# Patient Record
Sex: Male | Born: 1945 | Race: White | Hispanic: No | Marital: Married | Smoking: Former smoker
Health system: Southern US, Community
[De-identification: ages and names within clinical notes are randomized; demographics above are authoritative.]

## PROBLEM LIST (undated history)

## (undated) DIAGNOSIS — Z972 Presence of dental prosthetic device (complete) (partial): Secondary | ICD-10-CM

## (undated) DIAGNOSIS — E785 Hyperlipidemia, unspecified: Secondary | ICD-10-CM

## (undated) DIAGNOSIS — K08109 Complete loss of teeth, unspecified cause, unspecified class: Secondary | ICD-10-CM

## (undated) DIAGNOSIS — K219 Gastro-esophageal reflux disease without esophagitis: Secondary | ICD-10-CM

## (undated) DIAGNOSIS — M1990 Unspecified osteoarthritis, unspecified site: Secondary | ICD-10-CM

## (undated) DIAGNOSIS — I2510 Atherosclerotic heart disease of native coronary artery without angina pectoris: Secondary | ICD-10-CM

## (undated) DIAGNOSIS — D649 Anemia, unspecified: Secondary | ICD-10-CM

## (undated) DIAGNOSIS — I10 Essential (primary) hypertension: Secondary | ICD-10-CM

## (undated) HISTORY — PX: ROTATOR CUFF REPAIR: SHX139

## (undated) HISTORY — PX: TONSILLECTOMY: SUR1361

## (undated) HISTORY — PX: FRACTURE SURGERY: SHX138

## (undated) HISTORY — PX: HERNIA REPAIR: SHX51

## (undated) HISTORY — PX: UMBILICAL HERNIA REPAIR: SHX196

## (undated) HISTORY — PX: BACK SURGERY: SHX140

---

## 1977-02-06 HISTORY — PX: APPENDECTOMY: SHX54

## 1993-02-06 DIAGNOSIS — I259 Chronic ischemic heart disease, unspecified: Secondary | ICD-10-CM | POA: Insufficient documentation

## 2004-02-07 HISTORY — PX: CORONARY ANGIOPLASTY WITH STENT PLACEMENT: SHX49

## 2005-12-11 ENCOUNTER — Ambulatory Visit: Payer: Self-pay | Admitting: Gastroenterology

## 2010-02-06 HISTORY — PX: ORIF FEMUR FRACTURE: SHX2119

## 2010-12-13 ENCOUNTER — Inpatient Hospital Stay: Payer: Self-pay | Admitting: Internal Medicine

## 2010-12-13 HISTORY — PX: FLEXIBLE SIGMOIDOSCOPY: SHX1649

## 2010-12-19 ENCOUNTER — Inpatient Hospital Stay: Payer: Self-pay | Admitting: Internal Medicine

## 2011-02-07 HISTORY — PX: CHOLECYSTECTOMY: SHX55

## 2011-02-10 ENCOUNTER — Ambulatory Visit: Payer: Self-pay | Admitting: Unknown Physician Specialty

## 2011-02-10 DIAGNOSIS — D126 Benign neoplasm of colon, unspecified: Secondary | ICD-10-CM | POA: Diagnosis not present

## 2011-02-10 DIAGNOSIS — Z79899 Other long term (current) drug therapy: Secondary | ICD-10-CM | POA: Diagnosis not present

## 2011-02-10 DIAGNOSIS — K648 Other hemorrhoids: Secondary | ICD-10-CM | POA: Diagnosis not present

## 2011-02-10 DIAGNOSIS — K5289 Other specified noninfective gastroenteritis and colitis: Secondary | ICD-10-CM | POA: Diagnosis not present

## 2011-02-10 DIAGNOSIS — I2510 Atherosclerotic heart disease of native coronary artery without angina pectoris: Secondary | ICD-10-CM | POA: Diagnosis not present

## 2011-02-10 DIAGNOSIS — Z7982 Long term (current) use of aspirin: Secondary | ICD-10-CM | POA: Diagnosis not present

## 2011-02-10 DIAGNOSIS — K5730 Diverticulosis of large intestine without perforation or abscess without bleeding: Secondary | ICD-10-CM | POA: Diagnosis not present

## 2011-02-13 LAB — PATHOLOGY REPORT

## 2011-05-30 DIAGNOSIS — K5732 Diverticulitis of large intestine without perforation or abscess without bleeding: Secondary | ICD-10-CM | POA: Diagnosis not present

## 2011-12-05 ENCOUNTER — Inpatient Hospital Stay: Payer: Self-pay | Admitting: Specialist

## 2011-12-05 DIAGNOSIS — Z7902 Long term (current) use of antithrombotics/antiplatelets: Secondary | ICD-10-CM | POA: Diagnosis not present

## 2011-12-05 DIAGNOSIS — I10 Essential (primary) hypertension: Secondary | ICD-10-CM | POA: Diagnosis not present

## 2011-12-05 DIAGNOSIS — E785 Hyperlipidemia, unspecified: Secondary | ICD-10-CM | POA: Diagnosis not present

## 2011-12-05 DIAGNOSIS — IMO0002 Reserved for concepts with insufficient information to code with codable children: Secondary | ICD-10-CM | POA: Diagnosis not present

## 2011-12-05 DIAGNOSIS — S72143A Displaced intertrochanteric fracture of unspecified femur, initial encounter for closed fracture: Secondary | ICD-10-CM | POA: Diagnosis not present

## 2011-12-05 DIAGNOSIS — Z23 Encounter for immunization: Secondary | ICD-10-CM | POA: Diagnosis not present

## 2011-12-05 DIAGNOSIS — R6889 Other general symptoms and signs: Secondary | ICD-10-CM | POA: Diagnosis not present

## 2011-12-05 DIAGNOSIS — Z9861 Coronary angioplasty status: Secondary | ICD-10-CM | POA: Diagnosis not present

## 2011-12-05 DIAGNOSIS — Z01818 Encounter for other preprocedural examination: Secondary | ICD-10-CM | POA: Diagnosis not present

## 2011-12-05 DIAGNOSIS — M1990 Unspecified osteoarthritis, unspecified site: Secondary | ICD-10-CM | POA: Diagnosis not present

## 2011-12-05 DIAGNOSIS — S72009A Fracture of unspecified part of neck of unspecified femur, initial encounter for closed fracture: Secondary | ICD-10-CM | POA: Diagnosis not present

## 2011-12-05 DIAGNOSIS — Z7982 Long term (current) use of aspirin: Secondary | ICD-10-CM | POA: Diagnosis not present

## 2011-12-05 DIAGNOSIS — I2510 Atherosclerotic heart disease of native coronary artery without angina pectoris: Secondary | ICD-10-CM | POA: Diagnosis not present

## 2011-12-05 DIAGNOSIS — M25559 Pain in unspecified hip: Secondary | ICD-10-CM | POA: Diagnosis not present

## 2011-12-05 LAB — URINALYSIS, COMPLETE
Bacteria: NONE SEEN
Bilirubin,UR: NEGATIVE
Glucose,UR: NEGATIVE mg/dL (ref 0–75)
Ketone: NEGATIVE
Leukocyte Esterase: NEGATIVE
Ph: 5 (ref 4.5–8.0)
RBC,UR: 2 /HPF (ref 0–5)
Specific Gravity: 1.02 (ref 1.003–1.030)
Squamous Epithelial: NONE SEEN

## 2011-12-05 LAB — COMPREHENSIVE METABOLIC PANEL
Albumin: 3.7 g/dL (ref 3.4–5.0)
BUN: 16 mg/dL (ref 7–18)
Calcium, Total: 8.9 mg/dL (ref 8.5–10.1)
Chloride: 108 mmol/L — ABNORMAL HIGH (ref 98–107)
EGFR (African American): >60
Glucose: 131 mg/dL — ABNORMAL HIGH (ref 65–99)
Osmolality: 286 (ref 275–301)
SGOT(AST): 40 U/L — ABNORMAL HIGH (ref 15–37)
SGPT (ALT): 31 U/L (ref 12–78)
Sodium: 142 mmol/L (ref 136–145)
Total Protein: 7.1 g/dL (ref 6.4–8.2)

## 2011-12-05 LAB — CBC WITH DIFFERENTIAL/PLATELET
Basophil #: 0 10*3/uL (ref 0.0–0.1)
Basophil %: 0.4 %
HGB: 12.4 g/dL — ABNORMAL LOW (ref 13.0–18.0)
Lymphocyte #: 2.2 10*3/uL (ref 1.0–3.6)
Lymphocyte %: 30.1 %
MCH: 30.2 pg (ref 26.0–34.0)
MCHC: 33.9 g/dL (ref 32.0–36.0)
Neutrophil %: 57.1 %
Platelet: 173 10*3/uL (ref 150–440)
WBC: 7.4 10*3/uL (ref 3.8–10.6)

## 2011-12-05 LAB — APTT: Activated PTT: 32.9 secs (ref 23.6–35.9)

## 2011-12-06 LAB — CBC WITH DIFFERENTIAL/PLATELET
Basophil #: 0 10*3/uL (ref 0.0–0.1)
Eosinophil #: 0.1 10*3/uL (ref 0.0–0.7)
Eosinophil %: 0.8 %
HCT: 32.7 % — ABNORMAL LOW (ref 40.0–52.0)
Lymphocyte %: 21.9 %
MCH: 31.6 pg (ref 26.0–34.0)
MCHC: 35.5 g/dL (ref 32.0–36.0)
MCV: 89 fL (ref 80–100)
Monocyte %: 12.2 %
Neutrophil #: 5 10*3/uL (ref 1.4–6.5)
Neutrophil %: 64.7 %
Platelet: 138 10*3/uL — ABNORMAL LOW (ref 150–440)
RBC: 3.68 10*6/uL — ABNORMAL LOW (ref 4.40–5.90)
RDW: 13.1 % (ref 11.5–14.5)
WBC: 7.7 10*3/uL (ref 3.8–10.6)

## 2011-12-06 LAB — BASIC METABOLIC PANEL
BUN: 17 mg/dL (ref 7–18)
Calcium, Total: 8.7 mg/dL (ref 8.5–10.1)
Chloride: 109 mmol/L — ABNORMAL HIGH (ref 98–107)
Creatinine: 0.95 mg/dL (ref 0.60–1.30)
EGFR (Non-African Amer.): >60
Glucose: 152 mg/dL — ABNORMAL HIGH (ref 65–99)
Potassium: 3.8 mmol/L (ref 3.5–5.1)

## 2011-12-07 LAB — CBC WITH DIFFERENTIAL/PLATELET
Basophil %: 0.3 %
Eosinophil %: 0.4 %
HCT: 28.5 % — ABNORMAL LOW (ref 40.0–52.0)
HGB: 9.8 g/dL — ABNORMAL LOW (ref 13.0–18.0)
Lymphocyte #: 1.5 10*3/uL (ref 1.0–3.6)
MCH: 30.7 pg (ref 26.0–34.0)
MCHC: 34.5 g/dL (ref 32.0–36.0)
MCV: 89 fL (ref 80–100)
Monocyte #: 1 x10 3/mm (ref 0.2–1.0)
Monocyte %: 12.8 %
Neutrophil #: 5 10*3/uL (ref 1.4–6.5)
Neutrophil %: 66.1 %
RBC: 3.21 10*6/uL — ABNORMAL LOW (ref 4.40–5.90)
WBC: 7.5 10*3/uL (ref 3.8–10.6)

## 2011-12-07 LAB — BASIC METABOLIC PANEL
Anion Gap: 8 (ref 7–16)
BUN: 11 mg/dL (ref 7–18)
Calcium, Total: 8.5 mg/dL (ref 8.5–10.1)
Chloride: 106 mmol/L (ref 98–107)
Creatinine: 0.96 mg/dL (ref 0.60–1.30)
EGFR (African American): >60
EGFR (Non-African Amer.): >60
Glucose: 132 mg/dL — ABNORMAL HIGH (ref 65–99)
Osmolality: 279 (ref 275–301)
Sodium: 139 mmol/L (ref 136–145)

## 2011-12-10 DIAGNOSIS — E119 Type 2 diabetes mellitus without complications: Secondary | ICD-10-CM | POA: Diagnosis not present

## 2011-12-10 DIAGNOSIS — I10 Essential (primary) hypertension: Secondary | ICD-10-CM | POA: Diagnosis not present

## 2011-12-10 DIAGNOSIS — R262 Difficulty in walking, not elsewhere classified: Secondary | ICD-10-CM | POA: Diagnosis not present

## 2011-12-10 DIAGNOSIS — I2510 Atherosclerotic heart disease of native coronary artery without angina pectoris: Secondary | ICD-10-CM | POA: Diagnosis not present

## 2011-12-10 DIAGNOSIS — M1710 Unilateral primary osteoarthritis, unspecified knee: Secondary | ICD-10-CM | POA: Diagnosis not present

## 2011-12-10 DIAGNOSIS — S7290XD Unspecified fracture of unspecified femur, subsequent encounter for closed fracture with routine healing: Secondary | ICD-10-CM | POA: Diagnosis not present

## 2011-12-11 DIAGNOSIS — R262 Difficulty in walking, not elsewhere classified: Secondary | ICD-10-CM | POA: Diagnosis not present

## 2011-12-11 DIAGNOSIS — I10 Essential (primary) hypertension: Secondary | ICD-10-CM | POA: Diagnosis not present

## 2011-12-11 DIAGNOSIS — S7290XD Unspecified fracture of unspecified femur, subsequent encounter for closed fracture with routine healing: Secondary | ICD-10-CM | POA: Diagnosis not present

## 2011-12-11 DIAGNOSIS — M1710 Unilateral primary osteoarthritis, unspecified knee: Secondary | ICD-10-CM | POA: Diagnosis not present

## 2011-12-11 DIAGNOSIS — I2510 Atherosclerotic heart disease of native coronary artery without angina pectoris: Secondary | ICD-10-CM | POA: Diagnosis not present

## 2011-12-11 DIAGNOSIS — E119 Type 2 diabetes mellitus without complications: Secondary | ICD-10-CM | POA: Diagnosis not present

## 2011-12-14 DIAGNOSIS — S72143A Displaced intertrochanteric fracture of unspecified femur, initial encounter for closed fracture: Secondary | ICD-10-CM | POA: Diagnosis not present

## 2011-12-15 DIAGNOSIS — E119 Type 2 diabetes mellitus without complications: Secondary | ICD-10-CM | POA: Diagnosis not present

## 2011-12-15 DIAGNOSIS — I2510 Atherosclerotic heart disease of native coronary artery without angina pectoris: Secondary | ICD-10-CM | POA: Diagnosis not present

## 2011-12-15 DIAGNOSIS — S7290XD Unspecified fracture of unspecified femur, subsequent encounter for closed fracture with routine healing: Secondary | ICD-10-CM | POA: Diagnosis not present

## 2011-12-15 DIAGNOSIS — M1710 Unilateral primary osteoarthritis, unspecified knee: Secondary | ICD-10-CM | POA: Diagnosis not present

## 2011-12-15 DIAGNOSIS — I10 Essential (primary) hypertension: Secondary | ICD-10-CM | POA: Diagnosis not present

## 2011-12-15 DIAGNOSIS — R262 Difficulty in walking, not elsewhere classified: Secondary | ICD-10-CM | POA: Diagnosis not present

## 2011-12-19 DIAGNOSIS — R262 Difficulty in walking, not elsewhere classified: Secondary | ICD-10-CM | POA: Diagnosis not present

## 2011-12-19 DIAGNOSIS — I10 Essential (primary) hypertension: Secondary | ICD-10-CM | POA: Diagnosis not present

## 2011-12-19 DIAGNOSIS — M1710 Unilateral primary osteoarthritis, unspecified knee: Secondary | ICD-10-CM | POA: Diagnosis not present

## 2011-12-19 DIAGNOSIS — E119 Type 2 diabetes mellitus without complications: Secondary | ICD-10-CM | POA: Diagnosis not present

## 2011-12-19 DIAGNOSIS — I2510 Atherosclerotic heart disease of native coronary artery without angina pectoris: Secondary | ICD-10-CM | POA: Diagnosis not present

## 2011-12-19 DIAGNOSIS — S7290XD Unspecified fracture of unspecified femur, subsequent encounter for closed fracture with routine healing: Secondary | ICD-10-CM | POA: Diagnosis not present

## 2011-12-20 DIAGNOSIS — M1710 Unilateral primary osteoarthritis, unspecified knee: Secondary | ICD-10-CM | POA: Diagnosis not present

## 2011-12-20 DIAGNOSIS — I2510 Atherosclerotic heart disease of native coronary artery without angina pectoris: Secondary | ICD-10-CM | POA: Diagnosis not present

## 2011-12-20 DIAGNOSIS — E119 Type 2 diabetes mellitus without complications: Secondary | ICD-10-CM | POA: Diagnosis not present

## 2011-12-20 DIAGNOSIS — S7290XD Unspecified fracture of unspecified femur, subsequent encounter for closed fracture with routine healing: Secondary | ICD-10-CM | POA: Diagnosis not present

## 2011-12-20 DIAGNOSIS — I10 Essential (primary) hypertension: Secondary | ICD-10-CM | POA: Diagnosis not present

## 2011-12-20 DIAGNOSIS — R262 Difficulty in walking, not elsewhere classified: Secondary | ICD-10-CM | POA: Diagnosis not present

## 2011-12-22 DIAGNOSIS — I2510 Atherosclerotic heart disease of native coronary artery without angina pectoris: Secondary | ICD-10-CM | POA: Diagnosis not present

## 2011-12-22 DIAGNOSIS — I10 Essential (primary) hypertension: Secondary | ICD-10-CM | POA: Diagnosis not present

## 2011-12-22 DIAGNOSIS — R262 Difficulty in walking, not elsewhere classified: Secondary | ICD-10-CM | POA: Diagnosis not present

## 2011-12-22 DIAGNOSIS — S7290XD Unspecified fracture of unspecified femur, subsequent encounter for closed fracture with routine healing: Secondary | ICD-10-CM | POA: Diagnosis not present

## 2011-12-22 DIAGNOSIS — E119 Type 2 diabetes mellitus without complications: Secondary | ICD-10-CM | POA: Diagnosis not present

## 2011-12-22 DIAGNOSIS — M1710 Unilateral primary osteoarthritis, unspecified knee: Secondary | ICD-10-CM | POA: Diagnosis not present

## 2011-12-26 DIAGNOSIS — S7290XD Unspecified fracture of unspecified femur, subsequent encounter for closed fracture with routine healing: Secondary | ICD-10-CM | POA: Diagnosis not present

## 2011-12-26 DIAGNOSIS — M1710 Unilateral primary osteoarthritis, unspecified knee: Secondary | ICD-10-CM | POA: Diagnosis not present

## 2011-12-26 DIAGNOSIS — I2510 Atherosclerotic heart disease of native coronary artery without angina pectoris: Secondary | ICD-10-CM | POA: Diagnosis not present

## 2011-12-26 DIAGNOSIS — E119 Type 2 diabetes mellitus without complications: Secondary | ICD-10-CM | POA: Diagnosis not present

## 2011-12-26 DIAGNOSIS — R262 Difficulty in walking, not elsewhere classified: Secondary | ICD-10-CM | POA: Diagnosis not present

## 2011-12-26 DIAGNOSIS — I10 Essential (primary) hypertension: Secondary | ICD-10-CM | POA: Diagnosis not present

## 2011-12-28 DIAGNOSIS — I10 Essential (primary) hypertension: Secondary | ICD-10-CM | POA: Diagnosis not present

## 2011-12-28 DIAGNOSIS — R262 Difficulty in walking, not elsewhere classified: Secondary | ICD-10-CM | POA: Diagnosis not present

## 2011-12-28 DIAGNOSIS — E119 Type 2 diabetes mellitus without complications: Secondary | ICD-10-CM | POA: Diagnosis not present

## 2011-12-28 DIAGNOSIS — I2510 Atherosclerotic heart disease of native coronary artery without angina pectoris: Secondary | ICD-10-CM | POA: Diagnosis not present

## 2011-12-28 DIAGNOSIS — M1710 Unilateral primary osteoarthritis, unspecified knee: Secondary | ICD-10-CM | POA: Diagnosis not present

## 2011-12-28 DIAGNOSIS — S7290XD Unspecified fracture of unspecified femur, subsequent encounter for closed fracture with routine healing: Secondary | ICD-10-CM | POA: Diagnosis not present

## 2012-01-01 DIAGNOSIS — I2510 Atherosclerotic heart disease of native coronary artery without angina pectoris: Secondary | ICD-10-CM | POA: Diagnosis not present

## 2012-01-01 DIAGNOSIS — M1710 Unilateral primary osteoarthritis, unspecified knee: Secondary | ICD-10-CM | POA: Diagnosis not present

## 2012-01-01 DIAGNOSIS — R262 Difficulty in walking, not elsewhere classified: Secondary | ICD-10-CM | POA: Diagnosis not present

## 2012-01-01 DIAGNOSIS — I10 Essential (primary) hypertension: Secondary | ICD-10-CM | POA: Diagnosis not present

## 2012-01-01 DIAGNOSIS — S7290XD Unspecified fracture of unspecified femur, subsequent encounter for closed fracture with routine healing: Secondary | ICD-10-CM | POA: Diagnosis not present

## 2012-01-01 DIAGNOSIS — E119 Type 2 diabetes mellitus without complications: Secondary | ICD-10-CM | POA: Diagnosis not present

## 2012-01-03 DIAGNOSIS — S7290XD Unspecified fracture of unspecified femur, subsequent encounter for closed fracture with routine healing: Secondary | ICD-10-CM | POA: Diagnosis not present

## 2012-01-03 DIAGNOSIS — I2510 Atherosclerotic heart disease of native coronary artery without angina pectoris: Secondary | ICD-10-CM | POA: Diagnosis not present

## 2012-01-03 DIAGNOSIS — M1710 Unilateral primary osteoarthritis, unspecified knee: Secondary | ICD-10-CM | POA: Diagnosis not present

## 2012-01-03 DIAGNOSIS — R262 Difficulty in walking, not elsewhere classified: Secondary | ICD-10-CM | POA: Diagnosis not present

## 2012-01-03 DIAGNOSIS — I10 Essential (primary) hypertension: Secondary | ICD-10-CM | POA: Diagnosis not present

## 2012-01-03 DIAGNOSIS — E119 Type 2 diabetes mellitus without complications: Secondary | ICD-10-CM | POA: Diagnosis not present

## 2012-01-11 ENCOUNTER — Emergency Department: Payer: Self-pay | Admitting: Emergency Medicine

## 2012-01-11 DIAGNOSIS — I10 Essential (primary) hypertension: Secondary | ICD-10-CM | POA: Diagnosis not present

## 2012-01-11 DIAGNOSIS — K828 Other specified diseases of gallbladder: Secondary | ICD-10-CM | POA: Diagnosis not present

## 2012-01-11 DIAGNOSIS — R109 Unspecified abdominal pain: Secondary | ICD-10-CM | POA: Diagnosis not present

## 2012-01-11 LAB — COMPREHENSIVE METABOLIC PANEL
Anion Gap: 8 (ref 7–16)
Bilirubin,Total: 0.5 mg/dL (ref 0.2–1.0)
Calcium, Total: 9 mg/dL (ref 8.5–10.1)
Chloride: 107 mmol/L (ref 98–107)
Co2: 21 mmol/L (ref 21–32)
Creatinine: 1.02 mg/dL (ref 0.60–1.30)
EGFR (African American): >60
EGFR (Non-African Amer.): >60
Potassium: 4 mmol/L (ref 3.5–5.1)
SGOT(AST): 28 U/L (ref 15–37)
SGPT (ALT): 25 U/L (ref 12–78)

## 2012-01-11 LAB — CBC
HCT: 34.5 % — ABNORMAL LOW (ref 40.0–52.0)
MCH: 29.8 pg (ref 26.0–34.0)
MCHC: 33.3 g/dL (ref 32.0–36.0)
MCV: 89 fL (ref 80–100)
RBC: 3.86 10*6/uL — ABNORMAL LOW (ref 4.40–5.90)
RDW: 14 % (ref 11.5–14.5)

## 2012-01-11 LAB — URINALYSIS, COMPLETE
Bacteria: NONE SEEN
Blood: NEGATIVE
Glucose,UR: NEGATIVE mg/dL (ref 0–75)
Leukocyte Esterase: NEGATIVE
Ph: 6 (ref 4.5–8.0)
Protein: NEGATIVE
RBC,UR: NONE SEEN /HPF (ref 0–5)
Squamous Epithelial: NONE SEEN

## 2012-01-15 DIAGNOSIS — S72143A Displaced intertrochanteric fracture of unspecified femur, initial encounter for closed fracture: Secondary | ICD-10-CM | POA: Diagnosis not present

## 2012-01-17 DIAGNOSIS — R634 Abnormal weight loss: Secondary | ICD-10-CM | POA: Diagnosis not present

## 2012-01-17 DIAGNOSIS — R112 Nausea with vomiting, unspecified: Secondary | ICD-10-CM | POA: Diagnosis not present

## 2012-01-17 DIAGNOSIS — R1033 Periumbilical pain: Secondary | ICD-10-CM | POA: Diagnosis not present

## 2012-01-25 ENCOUNTER — Ambulatory Visit: Payer: Self-pay | Admitting: Unknown Physician Specialty

## 2012-01-25 DIAGNOSIS — R1084 Generalized abdominal pain: Secondary | ICD-10-CM | POA: Diagnosis not present

## 2012-01-25 DIAGNOSIS — R112 Nausea with vomiting, unspecified: Secondary | ICD-10-CM | POA: Diagnosis not present

## 2012-01-25 DIAGNOSIS — K828 Other specified diseases of gallbladder: Secondary | ICD-10-CM | POA: Diagnosis not present

## 2012-01-25 DIAGNOSIS — R109 Unspecified abdominal pain: Secondary | ICD-10-CM | POA: Diagnosis not present

## 2012-01-25 DIAGNOSIS — R634 Abnormal weight loss: Secondary | ICD-10-CM | POA: Diagnosis not present

## 2012-01-25 DIAGNOSIS — E871 Hypo-osmolality and hyponatremia: Secondary | ICD-10-CM | POA: Diagnosis not present

## 2012-01-29 DIAGNOSIS — D649 Anemia, unspecified: Secondary | ICD-10-CM | POA: Diagnosis not present

## 2012-01-29 DIAGNOSIS — R748 Abnormal levels of other serum enzymes: Secondary | ICD-10-CM | POA: Diagnosis not present

## 2012-02-02 ENCOUNTER — Ambulatory Visit: Payer: Self-pay | Admitting: Gastroenterology

## 2012-02-02 DIAGNOSIS — R933 Abnormal findings on diagnostic imaging of other parts of digestive tract: Secondary | ICD-10-CM | POA: Diagnosis not present

## 2012-02-02 DIAGNOSIS — E785 Hyperlipidemia, unspecified: Secondary | ICD-10-CM | POA: Diagnosis not present

## 2012-02-02 DIAGNOSIS — R1084 Generalized abdominal pain: Secondary | ICD-10-CM | POA: Diagnosis not present

## 2012-02-02 DIAGNOSIS — Z7982 Long term (current) use of aspirin: Secondary | ICD-10-CM | POA: Diagnosis not present

## 2012-02-02 DIAGNOSIS — R9389 Abnormal findings on diagnostic imaging of other specified body structures: Secondary | ICD-10-CM | POA: Diagnosis not present

## 2012-02-02 DIAGNOSIS — K449 Diaphragmatic hernia without obstruction or gangrene: Secondary | ICD-10-CM | POA: Diagnosis not present

## 2012-02-02 DIAGNOSIS — I252 Old myocardial infarction: Secondary | ICD-10-CM | POA: Diagnosis not present

## 2012-02-02 DIAGNOSIS — R110 Nausea: Secondary | ICD-10-CM | POA: Diagnosis not present

## 2012-02-02 DIAGNOSIS — R109 Unspecified abdominal pain: Secondary | ICD-10-CM | POA: Diagnosis not present

## 2012-02-02 DIAGNOSIS — K219 Gastro-esophageal reflux disease without esophagitis: Secondary | ICD-10-CM | POA: Diagnosis not present

## 2012-02-02 DIAGNOSIS — Z87891 Personal history of nicotine dependence: Secondary | ICD-10-CM | POA: Diagnosis not present

## 2012-02-02 DIAGNOSIS — IMO0002 Reserved for concepts with insufficient information to code with codable children: Secondary | ICD-10-CM | POA: Diagnosis not present

## 2012-02-02 DIAGNOSIS — R634 Abnormal weight loss: Secondary | ICD-10-CM | POA: Diagnosis not present

## 2012-02-02 DIAGNOSIS — I10 Essential (primary) hypertension: Secondary | ICD-10-CM | POA: Diagnosis not present

## 2012-02-02 DIAGNOSIS — Z79899 Other long term (current) drug therapy: Secondary | ICD-10-CM | POA: Diagnosis not present

## 2012-02-02 DIAGNOSIS — Z881 Allergy status to other antibiotic agents status: Secondary | ICD-10-CM | POA: Diagnosis not present

## 2012-02-08 ENCOUNTER — Ambulatory Visit: Payer: Self-pay | Admitting: Family Medicine

## 2012-02-08 DIAGNOSIS — S2239XA Fracture of one rib, unspecified side, initial encounter for closed fracture: Secondary | ICD-10-CM | POA: Diagnosis not present

## 2012-02-08 DIAGNOSIS — R05 Cough: Secondary | ICD-10-CM | POA: Diagnosis not present

## 2012-02-08 DIAGNOSIS — J40 Bronchitis, not specified as acute or chronic: Secondary | ICD-10-CM | POA: Diagnosis not present

## 2012-02-08 DIAGNOSIS — R079 Chest pain, unspecified: Secondary | ICD-10-CM | POA: Diagnosis not present

## 2012-02-08 DIAGNOSIS — R918 Other nonspecific abnormal finding of lung field: Secondary | ICD-10-CM | POA: Diagnosis not present

## 2012-02-08 DIAGNOSIS — J90 Pleural effusion, not elsewhere classified: Secondary | ICD-10-CM | POA: Diagnosis not present

## 2012-02-08 DIAGNOSIS — R109 Unspecified abdominal pain: Secondary | ICD-10-CM | POA: Diagnosis not present

## 2012-02-19 DIAGNOSIS — R1012 Left upper quadrant pain: Secondary | ICD-10-CM | POA: Diagnosis not present

## 2012-02-19 DIAGNOSIS — S72143A Displaced intertrochanteric fracture of unspecified femur, initial encounter for closed fracture: Secondary | ICD-10-CM | POA: Diagnosis not present

## 2012-02-19 DIAGNOSIS — R1032 Left lower quadrant pain: Secondary | ICD-10-CM | POA: Diagnosis not present

## 2012-02-19 DIAGNOSIS — R933 Abnormal findings on diagnostic imaging of other parts of digestive tract: Secondary | ICD-10-CM | POA: Diagnosis not present

## 2012-02-19 DIAGNOSIS — R748 Abnormal levels of other serum enzymes: Secondary | ICD-10-CM | POA: Diagnosis not present

## 2012-02-26 ENCOUNTER — Ambulatory Visit: Payer: Self-pay | Admitting: Unknown Physician Specialty

## 2012-02-26 DIAGNOSIS — R109 Unspecified abdominal pain: Secondary | ICD-10-CM | POA: Diagnosis not present

## 2012-02-26 DIAGNOSIS — R1012 Left upper quadrant pain: Secondary | ICD-10-CM | POA: Diagnosis not present

## 2012-02-26 DIAGNOSIS — R1032 Left lower quadrant pain: Secondary | ICD-10-CM | POA: Diagnosis not present

## 2012-02-26 DIAGNOSIS — R748 Abnormal levels of other serum enzymes: Secondary | ICD-10-CM | POA: Diagnosis not present

## 2012-02-29 ENCOUNTER — Ambulatory Visit: Payer: Self-pay | Admitting: Gastroenterology

## 2012-02-29 DIAGNOSIS — E785 Hyperlipidemia, unspecified: Secondary | ICD-10-CM | POA: Diagnosis not present

## 2012-02-29 DIAGNOSIS — K219 Gastro-esophageal reflux disease without esophagitis: Secondary | ICD-10-CM | POA: Diagnosis not present

## 2012-02-29 DIAGNOSIS — K8051 Calculus of bile duct without cholangitis or cholecystitis with obstruction: Secondary | ICD-10-CM | POA: Diagnosis not present

## 2012-02-29 DIAGNOSIS — I252 Old myocardial infarction: Secondary | ICD-10-CM | POA: Diagnosis not present

## 2012-02-29 DIAGNOSIS — K8050 Calculus of bile duct without cholangitis or cholecystitis without obstruction: Secondary | ICD-10-CM | POA: Diagnosis not present

## 2012-02-29 DIAGNOSIS — Z79899 Other long term (current) drug therapy: Secondary | ICD-10-CM | POA: Diagnosis not present

## 2012-02-29 DIAGNOSIS — Z87891 Personal history of nicotine dependence: Secondary | ICD-10-CM | POA: Diagnosis not present

## 2012-02-29 DIAGNOSIS — I10 Essential (primary) hypertension: Secondary | ICD-10-CM | POA: Diagnosis not present

## 2012-02-29 DIAGNOSIS — I2510 Atherosclerotic heart disease of native coronary artery without angina pectoris: Secondary | ICD-10-CM | POA: Diagnosis not present

## 2012-02-29 DIAGNOSIS — Z881 Allergy status to other antibiotic agents status: Secondary | ICD-10-CM | POA: Diagnosis not present

## 2012-03-11 DIAGNOSIS — K8040 Calculus of bile duct with cholecystitis, unspecified, without obstruction: Secondary | ICD-10-CM | POA: Diagnosis not present

## 2012-03-14 ENCOUNTER — Ambulatory Visit: Payer: Self-pay | Admitting: Surgery

## 2012-03-14 DIAGNOSIS — Z01812 Encounter for preprocedural laboratory examination: Secondary | ICD-10-CM | POA: Diagnosis not present

## 2012-03-14 DIAGNOSIS — K8010 Calculus of gallbladder with chronic cholecystitis without obstruction: Secondary | ICD-10-CM | POA: Diagnosis not present

## 2012-03-14 LAB — POTASSIUM: Potassium: 4.2 mmol/L (ref 3.5–5.1)

## 2012-03-22 ENCOUNTER — Ambulatory Visit: Payer: Self-pay | Admitting: Surgery

## 2012-03-22 DIAGNOSIS — I2510 Atherosclerotic heart disease of native coronary artery without angina pectoris: Secondary | ICD-10-CM | POA: Diagnosis not present

## 2012-03-22 DIAGNOSIS — Z79899 Other long term (current) drug therapy: Secondary | ICD-10-CM | POA: Diagnosis not present

## 2012-03-22 DIAGNOSIS — Z881 Allergy status to other antibiotic agents status: Secondary | ICD-10-CM | POA: Diagnosis not present

## 2012-03-22 DIAGNOSIS — K8010 Calculus of gallbladder with chronic cholecystitis without obstruction: Secondary | ICD-10-CM | POA: Diagnosis not present

## 2012-03-22 DIAGNOSIS — Z7982 Long term (current) use of aspirin: Secondary | ICD-10-CM | POA: Diagnosis not present

## 2012-03-22 DIAGNOSIS — I252 Old myocardial infarction: Secondary | ICD-10-CM | POA: Diagnosis not present

## 2012-03-22 DIAGNOSIS — K811 Chronic cholecystitis: Secondary | ICD-10-CM | POA: Diagnosis not present

## 2012-03-22 DIAGNOSIS — K8020 Calculus of gallbladder without cholecystitis without obstruction: Secondary | ICD-10-CM | POA: Diagnosis not present

## 2012-03-22 DIAGNOSIS — Z8249 Family history of ischemic heart disease and other diseases of the circulatory system: Secondary | ICD-10-CM | POA: Diagnosis not present

## 2012-03-22 DIAGNOSIS — K8050 Calculus of bile duct without cholangitis or cholecystitis without obstruction: Secondary | ICD-10-CM | POA: Diagnosis not present

## 2012-03-22 DIAGNOSIS — M129 Arthropathy, unspecified: Secondary | ICD-10-CM | POA: Diagnosis not present

## 2012-03-22 DIAGNOSIS — K219 Gastro-esophageal reflux disease without esophagitis: Secondary | ICD-10-CM | POA: Diagnosis not present

## 2012-03-22 DIAGNOSIS — M545 Low back pain, unspecified: Secondary | ICD-10-CM | POA: Diagnosis not present

## 2012-03-22 DIAGNOSIS — I10 Essential (primary) hypertension: Secondary | ICD-10-CM | POA: Diagnosis not present

## 2012-03-25 LAB — PATHOLOGY REPORT

## 2013-02-18 DIAGNOSIS — J329 Chronic sinusitis, unspecified: Secondary | ICD-10-CM | POA: Diagnosis not present

## 2013-02-18 DIAGNOSIS — J40 Bronchitis, not specified as acute or chronic: Secondary | ICD-10-CM | POA: Diagnosis not present

## 2013-03-05 DIAGNOSIS — M75120 Complete rotator cuff tear or rupture of unspecified shoulder, not specified as traumatic: Secondary | ICD-10-CM | POA: Diagnosis not present

## 2013-03-15 ENCOUNTER — Ambulatory Visit: Payer: Self-pay

## 2013-05-18 ENCOUNTER — Emergency Department: Payer: Self-pay | Admitting: Emergency Medicine

## 2013-05-18 DIAGNOSIS — I10 Essential (primary) hypertension: Secondary | ICD-10-CM | POA: Diagnosis not present

## 2013-05-18 DIAGNOSIS — L299 Pruritus, unspecified: Secondary | ICD-10-CM | POA: Diagnosis not present

## 2013-05-18 DIAGNOSIS — Z79899 Other long term (current) drug therapy: Secondary | ICD-10-CM | POA: Diagnosis not present

## 2013-05-18 DIAGNOSIS — Z7982 Long term (current) use of aspirin: Secondary | ICD-10-CM | POA: Diagnosis not present

## 2013-05-26 ENCOUNTER — Emergency Department: Payer: Self-pay | Admitting: Emergency Medicine

## 2013-05-26 DIAGNOSIS — Z7982 Long term (current) use of aspirin: Secondary | ICD-10-CM | POA: Diagnosis not present

## 2013-05-26 DIAGNOSIS — Z888 Allergy status to other drugs, medicaments and biological substances status: Secondary | ICD-10-CM | POA: Diagnosis not present

## 2013-05-26 DIAGNOSIS — G2581 Restless legs syndrome: Secondary | ICD-10-CM | POA: Diagnosis not present

## 2013-05-26 DIAGNOSIS — Z9089 Acquired absence of other organs: Secondary | ICD-10-CM | POA: Diagnosis not present

## 2013-05-26 DIAGNOSIS — R209 Unspecified disturbances of skin sensation: Secondary | ICD-10-CM | POA: Diagnosis not present

## 2013-05-26 DIAGNOSIS — K7689 Other specified diseases of liver: Secondary | ICD-10-CM | POA: Diagnosis not present

## 2013-05-26 DIAGNOSIS — R109 Unspecified abdominal pain: Secondary | ICD-10-CM | POA: Diagnosis not present

## 2013-05-26 DIAGNOSIS — I10 Essential (primary) hypertension: Secondary | ICD-10-CM | POA: Diagnosis not present

## 2013-05-26 LAB — CBC WITH DIFFERENTIAL/PLATELET
Basophil #: 0 10*3/uL (ref 0.0–0.1)
Basophil %: 0.3 %
EOS PCT: 0.9 %
Eosinophil #: 0.1 10*3/uL (ref 0.0–0.7)
HCT: 38.8 % — AB (ref 40.0–52.0)
HGB: 12 g/dL — ABNORMAL LOW (ref 13.0–18.0)
Lymphocyte #: 2.3 10*3/uL (ref 1.0–3.6)
Lymphocyte %: 20.2 %
MCH: 25.5 pg — AB (ref 26.0–34.0)
MCHC: 30.8 g/dL — ABNORMAL LOW (ref 32.0–36.0)
MCV: 83 fL (ref 80–100)
MONO ABS: 1 x10 3/mm (ref 0.2–1.0)
Monocyte %: 9 %
Neutrophil #: 7.8 10*3/uL — ABNORMAL HIGH (ref 1.4–6.5)
Neutrophil %: 69.6 %
Platelet: 277 10*3/uL (ref 150–440)
RBC: 4.7 10*6/uL (ref 4.40–5.90)
RDW: 15.2 % — ABNORMAL HIGH (ref 11.5–14.5)
WBC: 11.2 10*3/uL — AB (ref 3.8–10.6)

## 2013-05-26 LAB — COMPREHENSIVE METABOLIC PANEL
ALBUMIN: 3.8 g/dL (ref 3.4–5.0)
ALK PHOS: 96 U/L
ANION GAP: 5 — AB (ref 7–16)
BUN: 14 mg/dL (ref 7–18)
Bilirubin,Total: 0.5 mg/dL (ref 0.2–1.0)
Calcium, Total: 9 mg/dL (ref 8.5–10.1)
Chloride: 106 mmol/L (ref 98–107)
Co2: 25 mmol/L (ref 21–32)
Creatinine: 0.88 mg/dL (ref 0.60–1.30)
EGFR (African American): >60
EGFR (Non-African Amer.): >60
Glucose: 96 mg/dL (ref 65–99)
Osmolality: 272 (ref 275–301)
Potassium: 4.1 mmol/L (ref 3.5–5.1)
SGOT(AST): 39 U/L — ABNORMAL HIGH (ref 15–37)
SGPT (ALT): 51 U/L (ref 12–78)
SODIUM: 136 mmol/L (ref 136–145)
Total Protein: 6.9 g/dL (ref 6.4–8.2)

## 2013-05-26 LAB — URINALYSIS, COMPLETE
Bacteria: NONE SEEN
Bilirubin,UR: NEGATIVE
Blood: NEGATIVE
Glucose,UR: NEGATIVE mg/dL (ref 0–75)
Ketone: NEGATIVE
LEUKOCYTE ESTERASE: NEGATIVE
Nitrite: NEGATIVE
PH: 6 (ref 4.5–8.0)
PROTEIN: NEGATIVE
RBC,UR: NONE SEEN /HPF (ref 0–5)
Specific Gravity: 1.004 (ref 1.003–1.030)
Squamous Epithelial: NONE SEEN

## 2013-05-26 LAB — LIPASE, BLOOD: Lipase: 174 U/L (ref 73–393)

## 2014-05-26 NOTE — Op Note (Signed)
PATIENT NAME:  Donald [name], Donald [name] MR#:  [id number] DATE OF BIRTH:  12/05/1945  DATE OF PROCEDURE:  12/06/2011  PREOPERATIVE DIAGNOSIS: Nondisplaced intertrochanteric fracture, left hip.   POSTOPERATIVE DIAGNOSIS: Nondisplaced intertrochanteric fracture, left hip.   PROCEDURE PERFORMED: Open reduction internal fixation left hip with a Synthes DHS compression screw and plate (176 degrees four hole plate, 85 mm lag screw, four cortical screws).   SURGEON: Park Breed, MD  ANESTHESIA: General endotracheal.   COMPLICATIONS: None.   DRAINS: None.   ESTIMATED BLOOD LOSS: 200 mL.  REPLACEMENT: None.   DESCRIPTION OF PROCEDURE: The patient was brought to the Operating Room where underwent satisfactory general endotracheal anesthesia since he had been several prior back surgeries. Placed in the supine position on the fracture table and the right leg was flexed and abducted. The left leg was placed in gentle traction and internally rotated. The left hip was prepped and draped in sterile fashion. Fluoroscopy showed the fracture remained nondisplaced. The short longitudinal incision was made laterally over the proximal femur and dissection carried out sharply through subcutaneous tissue and fascia. Electrocautery was used for hemostasis. The vastus lateralis muscle was elevated off its posterior attachments exposing the lateral shaft of the femur. Quarter-inch drill hole was made and a 135 degree guide was used to introduce the guide pin into the head and neck of the femur. This was seen to be in excellent position on AP and lateral view and it was advanced near the cortical surface of the femoral head. Measurements indicated 85 mm lag screw would be adequate. The step cup reamer was used to enlarge the opening in the lateral femur and a tap was used to open up the tract for the screw. 85 mm lag screw with a four hole 135 degree plate were attached and fit snugly. The plate was affixed to the shaft with  four cortical screws. Traction was released as there was very little on it. The fluoroscopy showed the hardware and the fracture to be in excellent position. The wound was irrigated. There was not much bleeding so I did not put a drain in it. The fascia was closed with running 0 Vicryl and the subcutaneous tissue was closed with running 2-0 Vicryl. Skin was closed with staples and a dry sterile dressing was applied. Leg was taken out of traction, had good motion. The patient was awakened then transferred to his hospital bed and taken to recovery in good condition.  ____________________________ Park Breed, MD hem:cms D: 12/06/2011 12:26:28 ET T: 12/06/2011 12:42:11 ET JOB#: [id number]  cc: Park Breed, MD, <Dictator>  Park Breed MD ELECTRONICALLY SIGNED 12/07/2011 7:49

## 2014-05-26 NOTE — Discharge Summary (Signed)
PATIENT NAME:  Donald [name], Donald [name] MR#:  [id number] DATE OF BIRTH:  [birth year]  DATE OF ADMISSION:  12/05/2011 DATE OF DISCHARGE:  12/09/2011  FINAL DIAGNOSES:  1. Left intertrochanteric hip fracture.  2. Hypertension.  3. Hyperlipidemia.  4. Osteoarthritis.  5. Coronary artery disease, status post stent placement.   OPERATION: 12/06/2011 left hip compression nailing.   COMPLICATIONS: None.   CONSULTATION: [facility]    HOME AND DISCHARGE MEDICATIONS:  1. Norco 5/325 mg q.6 hours p.r.n. pain.  2. Enteric-coated aspirin one p.o. b.i.d.  3. Protonix 40 mg daily. 4. Norvasc 5 mg daily.  5. Meloxicam 15 mg daily. 6. Lisinopril 20 mg daily.  7. Fosamax 70 mg weekly.  8. Clonazepam 2 mg daily. 9. Carvedilol 3.125 mg b.i.d.  10. Atorvastatin 80 mg daily.   HISTORY OF PRESENT ILLNESS: The patient is a 69 year old male who fell going out his patio door into the garage to get in his car. His left knee has been weak and he felt it give way and he fell on the left hip. He was unable to get up and was brought to the Emergency Room by ambulance where exam and x-rays revealed a minimally displaced intertrochanteric fracture of the left hip. The patient was admitted for medical clearance and surgery.    PAST MEDICAL HISTORY: ILLNESSES: As above.   MEDICATIONS: As above.   ALLERGIES: None.   PAST SURGICAL HISTORY:  1. Stent placement in 2000. 2. Stress test in 2005.   REVIEW OF SYSTEMS: Unremarkable.   FAMILY HISTORY: Unremarkable.    SOCIAL HISTORY: The patient lives at home with his wife. He does not smoke or drink. He is retired.   PHYSICAL EXAMINATION: The patient was alert and cooperative. Vital signs were normal. Left leg had pain with motion. There was some rotation but no shortening. The neurovascular status was good.   LABORATORY DATA: Laboratory data on admission was satisfactory.   HOSPITAL COURSE: The patient was seen by the medical service and cleared for surgery. He  underwent left hip nailing on 12/06/2011. Postoperatively he did well. Hemoglobin dropped to a low of 9.2 on the second postop day. He was gradually ambulated. He had satisfactory gradual progress with physical therapy and he was stable and ready for discharge home with home health PT on 12/09/2011. He is to be seen in my office in two weeks. He should be partial weightbearing on the left leg.   ____________________________ Park Breed, MD hem:drc D: 12/14/2011 08:48:54 ET T: 12/14/2011 14:53:17 ET JOB#: [id number]  cc: Park Breed, MD, <Dictator>, Juline Patch, MD Park Breed MD ELECTRONICALLY SIGNED 12/14/2011 18:39

## 2014-05-26 NOTE — H&P (Signed)
Subjective/Chief Complaint Left hip pain    History of Present Illness 69 year old male fell in his driveway last night injuring the left hip.  Brought to Emergency Room where exam and X-rays show a non displaced intertrochanteric fracture of the left hip.  Admitted for medical evaluation and surgery.  Cleared by medical service.  Has history of Coronary artery disease with stent years ago.  Also several back surgeries.  Risks and benefits of surgery were discussed at length including but not limited to infection, non union, nerve or blood vessed damage, non union, need for repeat surgery, blood clots and lung emboli, and death. Treatment options also discussed and patient and family wish to proceed with surgery.    Past Medical Health Coronary Artery Disease, Hypertension    Primary Physician Otilio Miu   Past Med/Surgical Hx:  Left hip/femur fracture:   Hypercholesterolemia:   htn:   appendix removed:   hernia repair:   back surgery:   stent:   ALLERGIES:  No Known Allergies:   HOME MEDICATIONS: Medication Instructions Status  meloxicam 15 mg oral tablet 1 tab(s) orally once a day Active  aspirin 81 mg oral tablet, chewable 1 tab(s) orally once a day Active  amlodipine 5 mg oral tablet 1.5 tab(s) orally once a day Active  lisinopril 20 mg oral tablet 1 tab(s) orally once a day Active  carvedilol 3.125 mg oral tablet 1 tab(s) orally 2 times a day Active  omeprazole 20 mg oral delayed release capsule 1 cap(s) orally 2 times a day Active  meloxicam 15 mg oral tablet 1 tab(s) orally once a day Active  Calcium 600+D 600 mg-200 intl units oral tablet 1 tab(s) orally once a day (at bedtime) Active  clonazepam 2 mg oral tablet 1 tab(s) orally once a day (at bedtime) Active  diclofenac potassium 50 mg oral tablet 2 tab(s) orally 2 times a day Active  atorvastatin 80 mg oral tablet 1 tab(s) orally once a day (at bedtime) Active   Family and Social History:   Family History  Non-Contributory    Social History negative tobacco, negative ETOH    Place of Living Home   Review of Systems:   Fever/Chills No    Cough No    Sputum No    Abdominal Pain No   Physical Exam:   GEN well developed, well nourished, no acute distress, thin    HEENT pink conjunctivae    NECK supple    RESP normal resp effort    CARD regular rate    ABD denies tenderness    GU foley catheter in place    LYMPH negative neck    EXTR negative edema, Left leg painful to range of motion.  circulation/sensation/motor function good.  No shortening.  skin intact.    SKIN normal to palpation    NEURO motor/sensory function intact    PSYCH alert, A+O to time, place, person, good insight   Lab Results: Hepatic:  29-Oct-13 20:49    Bilirubin, Total 0.6   Alkaline Phosphatase 127   SGPT (ALT) 31   SGOT (AST)  40   Total Protein, Serum 7.1   Albumin, Serum 3.7  Routine BB:  29-Oct-13 20:49    ABO Group + Rh Type B Negative   Antibody Screen POSITIVE (Result(s) reported on 06 Dec 2011 at 07:01AM.)  Routine Chem:  29-Oct-13 20:49    Glucose, Serum  131   BUN 16   Creatinine (comp) 1.08   Sodium,  Serum 142   Potassium, Serum 3.9   Chloride, Serum  108   CO2, Serum 23   Calcium (Total), Serum 8.9   Anion Gap 11   Osmolality (calc) 286   eGFR (African American) >60   eGFR (Non-African American) >60 (eGFR values <80m/min/1.73 m2 may be an indication of chronic kidney disease (CKD). Calculated eGFR is useful in patients with stable renal function. The eGFR calculation will not be reliable in acutely ill patients when serum creatinine is changing rapidly. It is not useful in  patients on dialysis. The eGFR calculation may not be applicable to patients at the low and high extremes of body sizes, pregnant women, and vegetarians.)  30-Oct-13 04:13    Glucose, Serum  152   BUN 17   Creatinine (comp) 0.95   Sodium, Serum 142   Potassium, Serum 3.8   Chloride, Serum   109   CO2, Serum 24   Calcium (Total), Serum 8.7   Anion Gap 9   Osmolality (calc) 288   eGFR (African American) >60   eGFR (Non-African American) >60 (eGFR values <650mmin/1.73 m2 may be an indication of chronic kidney disease (CKD). Calculated eGFR is useful in patients with stable renal function. The eGFR calculation will not be reliable in acutely ill patients when serum creatinine is changing rapidly. It is not useful in  patients on dialysis. The eGFR calculation may not be applicable to patients at the low and high extremes of body sizes, pregnant women, and vegetarians.)  Routine UA:  29-Oct-13 22:25    Color (UA) Yellow   Clarity (UA) Clear   Glucose (UA) Negative   Bilirubin (UA) Negative   Ketones (UA) Negative   Specific Gravity (UA) 1.020   Blood (UA) Negative   pH (UA) 5.0   Protein (UA) Negative   Nitrite (UA) Negative   Leukocyte Esterase (UA) Negative (Result(s) reported on 05 Dec 2011 at 10:40PM.)   RBC (UA) 2 /HPF   WBC (UA) 1 /HPF   Bacteria (UA) NONE SEEN   Epithelial Cells (UA) NONE SEEN   Mucous (UA) PRESENT   Hyaline Cast (UA) 3 /LPF (Result(s) reported on 05 Dec 2011 at 10:40PM.)  Routine Coag:  29-Oct-13 20:49    Activated PTT (APTT) 32.9 (A HCT value >55% may artifactually increase the APTT. In one study, the increase was an average of 19%. Reference: "Effect on Routine and Special Coagulation Testing Values of Citrate Anticoagulant Adjustment in Patients with High HCT Values." American Journal of Clinical Pathology 2006;126:400-405.)   Prothrombin 13.2   INR 1.0 (INR reference interval applies to patients on anticoagulant therapy. A single INR therapeutic range for coumarins is not optimal for all indications; however, the suggested range for most indications is 2.0 - 3.0. Exceptions to the INR Reference Range may include: Prosthetic heart valves, acute myocardial infarction, prevention of myocardial infarction, and combinations of aspirin  and anticoagulant. The need for a higher or lower target INR must be assessed individually. Reference: The Pharmacology and Management of the Vitamin K  antagonists: the seventh ACCP Conference on Antithrombotic and Thrombolytic Therapy. ChXNATF.5732ept:126 (3suppl): [id number] HCT value >55% may artifactually increase the PT.  In one study,  the increase was an average of 25%. Reference:  "Effect on Routine and Special Coagulation Testing Values of Citrate Anticoagulant Adjustment in Patients with High HCT Values." American Journal of Clinical Pathology 2006;126:400-405.)  Routine Hem:  29-Oct-13 20:49    WBC (CBC) 7.4   RBC (CBC)  4.11  Hemoglobin (CBC)  12.4   Hematocrit (CBC)  36.7   Platelet Count (CBC) 173   MCV 89   MCH 30.2   MCHC 33.9   RDW 13.0   Neutrophil % 57.1   Lymphocyte % 30.1   Monocyte % 11.3   Eosinophil % 1.1   Basophil % 0.4   Neutrophil # 4.2   Lymphocyte # 2.2   Monocyte # 0.8   Eosinophil # 0.1   Basophil # 0.0 (Result(s) reported on 05 Dec 2011 at 09:41PM.)  30-Oct-13 04:13    WBC (CBC) 7.7   RBC (CBC)  3.68   Hemoglobin (CBC)  11.6   Hematocrit (CBC)  32.7   Platelet Count (CBC)  138   MCV 89   MCH 31.6   MCHC 35.5   RDW 13.1   Neutrophil % 64.7   Lymphocyte % 21.9   Monocyte % 12.2   Eosinophil % 0.8   Basophil % 0.4   Neutrophil # 5.0   Lymphocyte # 1.7   Monocyte # 0.9   Eosinophil # 0.1   Basophil # 0.0 (Result(s) reported on 06 Dec 2011 at 05:03AM.)     Assessment/Admission Diagnosis Intertrochanteric fracture left hip    Plan open reduction and internal fixation left hip with compression nail.   Electronic Signatures: Park Breed (MD)  (Signed 30-Oct-13 10:30)  Authored: CHIEF COMPLAINT and HISTORY, PAST MEDICAL/SURGIAL HISTORY, ALLERGIES, HOME MEDICATIONS, FAMILY AND SOCIAL HISTORY, REVIEW OF SYSTEMS, PHYSICAL EXAM, LABS, ASSESSMENT AND PLAN   Last Updated: 30-Oct-13 10:30 by Park Breed (MD)

## 2014-05-26 NOTE — Consult Note (Signed)
PATIENT NAME:  Donald [name], Donald [name] MR#:  [id number] DATE OF BIRTH:  05-05-1945  DATE OF CONSULTATION:  12/06/2011  REFERRING PHYSICIAN:   CONSULTING PHYSICIAN:  Nicholes Mango, MD  PRIMARY CARE PHYSICIAN:  Dr. Otilio Miu  REASON FOR CONSULT:  Medical management and preop clearance.  CHIEF COMPLAINT:  Hip pain.  HISTORY OF PRESENT ILLNESS:  The patient is a 69 year old Caucasian male with a past medical history of hypertension, hyperlipidemia, osteoarthritis, and coronary artery disease status post stent who presented to the ER with the chief complaint of left hip pain after he sustained a fall.  The patient is reporting that when he was going out through the patio door into the garage to get into his car, he felt his left knee give way and he fell on the floor.  He landed on his left hip and was not able to get up off the floor immediately.  He rolled onto his right side and hollered for his wife.  As the patient was in terrible pain and was not able to stand up even with assistance, they called EMS and the patient was brought to the ER.   He had x-rays that revealed left hip fracture regarding which an orthopedic consult was placed and the patient was admitted to the orthopedic service regarding the fracture.  Hospitalist team is consulted for medical management of his medical issues.  Also they have requested preop clearance.  During my examination the patient is complaining of left hip pain but it is well controlled with pain management.  He has a history of coronary artery disease and one stent was placed in the year 2000.  Eventually he had a repeat stress test done in the year 2005 which was apparently normal and cardiologist recommended to just follow up with his primary care physician and follow up with cardiology on an as-needed basis.  For the past 13 years the patient has been in stable condition.  No other episodes of chest pain have occurred.  The patient denies any chest pain, abdominal pain,  nausea, vomiting, or diarrhea.  He denies any other complaints.  PAST MEDICAL HISTORY: 1. Hypertension. 2. Hyperlipidemia. 3. Osteoarthritis. 4. Coronary artery disease.   PAST SURGICAL HISTORY: 1. Stent placement in the year 2000. 2. Stress test 2005.  ALLERGIES: No known drug allergies.     HOME MEDICATIONS: 1. Omeprazole 20 mg, 1 capsule p.o. b.i.d. 2. Meloxicam 15 mg, 1 tablet p.o. once daily. 3. Lisinopril 20 mg once daily. 4. Diclofenac 50 mg, 2 tablets twice daily. 5. Clonazepam 2 mg, 1 tablet p.o. once daily. 6. Coreg 3.125 mg p.o. b.i.d. 7. Calcium with vitamin D 1 tablet p.o. once daily. 8. Atorvastatin 80 mg p.o. once daily. 9. Aspirin 81 mg once daily. 10. Amlodipine 5 mg, 1-1/2 tablets once daily.  PSYCHOSOCIAL HISTORY:  Lives at home with his wife.  Denies smoking, alcohol abuse, or drug usage.  FAMILY HISTORY:  Reviewed.  Denies any hypertension or hyperlipidemia.  REVIEW OF SYSTEMS:  CONSTITUTIONAL:  Denies fever, fatigue, weakness, weight loss, or weight gain. EYES:  Denies blurry vision or glaucoma. ENT:  Denies tinnitus, ear pain, or hearing loss.  RESPIRATORY:  Denies cough, wheezing, hemoptysis, or chronic obstructive pulmonary disease.  CARDIOVASCULAR:  Denies chest pain, orthopnea, edema, or syncope.  GI: Denies nausea, vomiting, diarrhea, or abdominal pain.  Denies any rectal bleeding.  GU:  Denies dysuria or hematuria.  ENDOCRINE:  Denies polyuria, polydipsia, or nocturia. HEME:  Denies anemia or regular  bruising.  INTEGUMENT:  Denies acne, rash, or lesions.  NEURO:  Denies numbness, weakness, or dysarthria.  PSYCH: Denies insomnia, anxiety, or depression.  EXTREMITIES:  Complaining of left hip pain.  PHYSICAL EXAMINATION: VITAL SIGNS:  Temperature 98.7, pulse 73, respiratory rate 18, blood pressure 138/77, pulse oximetry 94-96%.    GENERAL:  Not in acute distress.  Answering questions appropriately. Well built, well nourished.  HEENT:  Normocephalic,  atraumatic.  Pupils equal, round, reactive to light and accommodation.  NECK:  Supple.  No jugular venous distention.  No masses.  LUNGS:  Clear to auscultation bilaterally.  No crackles, no wheezing.  CARDIOVASCULAR:  S1, S2 normal.  Regular rate and rhythm.  No murmurs.  GI:  Soft.  Bowel sounds are present in all four quadrants.  Nontender, nondistended.  No masses felt.  NEURO:  Awake, alert, oriented times three.  Cranial nerves II-XII are grossly intact.     EXTREMITIES:  Left hip is abducted and externally rotated.  Tender in the left hip area.  No bruising noticed.   SKIN:  No lesions, no rashes, no jaundice.  LABORATORY DATA:  Limited studies.  White blood count 7.4, hemoglobin 12.4, hematocrit 36.7, platelet count 173,000.  Prothrombin time 13.2, INR 1.  Activated PTT 32.9.  Sodium 142, potassium 3.9, chloride 108, CO2 23, BUN 16, creatinine 1.08, serum osmolality 286, calcium 8.9.  Urinalysis:  Yellow in color, clear in appearance.  Nitrites and leukocytes negative.  Glucose negative. Ketones negative.     ASSESSMENT AND PLAN: 1. Left hip pain from fracture:  Admitted to orthopedics.  The patient is medically optimized for surgery. 2. History of coronary artery disease status post stent:  Clinically stable.  The patient is asymptomatic at this time. Continue aspirin, beta blocker, and statin.  We will temporarily hold the aspirin today but we will resume it postoperatively.  We will continue medical management. 3. Hypertension:  Continue home medications and titrate as needed. 4. Hyperlipidemia:  Continue statin. 5. We will provide GI and deep vein thrombosis prophylaxis. 6. CODE STATUS:  FULL CODE.  The plan of care was discussed with the patient.  He is agreeable to the plan. Thank you for allowing [facility] hospitalist team to take care of this patient to provide medical management.  TOTAL TIME SPENT ON CONSULT:  45 minutes.    ____________________________ Nicholes Mango,  MD ag:bjt D: 12/06/2011 06:20:00 ET T: 12/06/2011 09:26:50 ET JOB#: [id number]  cc: Juline Patch, MD Nicholes Mango, MD, <Dictator> Nicholes Mango MD ELECTRONICALLY SIGNED 12/16/2011 23:57

## 2014-05-29 NOTE — Op Note (Signed)
PATIENT NAME:  Donald [name], Donald [name] MR#:  [id number] DATE OF BIRTH:  23-Jan-1946  DATE OF PROCEDURE:  03/22/2012  PREOPERATIVE DIAGNOSIS: Chronic cholecystitis, cholelithiasis.   POSTOPERATIVE DIAGNOSIS: Chronic cholecystitis, cholelithiasis.   PROCEDURE: Laparoscopic cholecystectomy, cholangiogram.   SURGEON: Rochel Brome, M.D.   ANESTHESIA: General.   INDICATIONS: This 69 year old male recently had abdominal pain and elevated alkaline phosphatase. He had CT scan demonstrating distention of the gallbladder. MRI demonstrated common duct stones. He had ERCP and sphincterotomy with removal of common duct stones and he was advised to have cholecystectomy to help avoid future development of stones and help avoid future pain.   DESCRIPTION OF PROCEDURE: The patient was placed on the operating table in the supine position and under general anesthesia the abdomen was prepared with ChloraPrep and draped in a sterile manner. A short incision was made in the inferior aspect of the umbilicus and carried down to the deep fascia, which was grasped with laryngeal hook and elevated. A Veress needle was inserted, aspirated, and irrigated with a saline solution. Next, the peritoneal cavity was inflated with carbon dioxide. The Veress needle was removed. The 10 mm cannula was inserted. The 10 mm, 0 degree laparoscope was inserted to view the peritoneal cavity. There were some adhesions between the omentum and the anterior abdominal wall just above the umbilicus. The scope was directed to demonstrate the liver, which was smooth. Another incision was made in the epigastrium right of the midline to introduce a 10 mm cannula. Two incisions were made in the lateral aspect of the right upper quadrant to introduce two 5-mm cannulas.   Multiple adhesions were found in the right upper quadrant. The gallbladder was retracted towards the right shoulder. Multiple adhesions were taken down from the gallbladder with the use of scissors,  blunt dissection and electrocautery. The gallbladder was retracted towards the right shoulder. The infundibulum was retracted inferiorly and laterally. Multiple additional adhesions were taken down. The neck of the gallbladder was mobilized with incision of the visceral peritoneum. The cystic artery was dissected free from surrounding structures. The cystic duct was dissected free from surrounding structures. The neck of the gallbladder was further mobilized. This was a somewhat tedious dissection due to a large amount of scar tissue. The cystic duct appeared to be somewhat large in size. A critical view of safety was demonstrated. The cystic artery was controlled with double endoclips and divided, allowing better exposure of the cystic duct. An Endo Clip was placed across the cystic duct adjacent to the neck of the gallbladder. An incision was made in the cystic duct to introduce a Reddick catheter. Half-strength Conray-60 dye was injected as the cholangiogram was done with fluoroscopy demonstrating mild dilatation of the biliary tree and prompt flow into the duodenum. There was a smooth taper of the distal common bile duct. A Reddick catheter was removed. The cystic duct was ligated with an Endo Clip and divided.   Next, it was further ligated with chromic Endoloop and then one additional clip was placed across the cystic duct. Next, the gallbladder was dissected free from the liver with a somewhat tedious dissection. There was some drainage of bile from the gallbladder, which was aspirated. The gallbladder was further dissected and completely separated from the liver. Several small bleeding points along the gallbladder bed were cauterized. Next, the gallbladder was placed into an Endo Catch bag and delivered out through the infraumbilical port site and submitted in formalin for routine pathology. The right upper quadrant was further inspected,  irrigated and aspirated. Several small bleeding points were  cauterized. Hemostasis was subsequently intact. The cannulas were removed allowing carbon dioxide to escape from the peritoneal cavity. Skin at the fascial defect below the umbilicus was closed with a 0 Vicryl figure-of-eight stitch. The skin incisions were closed with interrupted 5-0 chromic subcuticular sutures, benzoin, and Steri-Strips. Dressings were applied with paper tape. The patient tolerated surgery satisfactorily and was moved to the recovery room for postoperative care.  ____________________________ J. Rochel Brome, MD jws:aw D: 03/22/2012 09:52:29 ET T: 03/22/2012 10:06:57 ET JOB#: [id number]  cc: Loreli Dollar, MD, <Dictator> Loreli Dollar MD ELECTRONICALLY SIGNED 03/23/2012 20:40

## 2014-06-08 DIAGNOSIS — R6881 Early satiety: Secondary | ICD-10-CM | POA: Diagnosis not present

## 2014-06-08 DIAGNOSIS — R634 Abnormal weight loss: Secondary | ICD-10-CM | POA: Diagnosis not present

## 2014-06-08 DIAGNOSIS — D509 Iron deficiency anemia, unspecified: Secondary | ICD-10-CM | POA: Diagnosis not present

## 2014-06-16 NOTE — Discharge Instructions (Signed)

## 2014-06-22 ENCOUNTER — Encounter: Payer: Self-pay | Admitting: *Deleted

## 2014-06-25 ENCOUNTER — Ambulatory Visit: Payer: Non-veteran care | Admitting: Student in an Organized Health Care Education/Training Program

## 2014-06-25 ENCOUNTER — Other Ambulatory Visit: Payer: Self-pay | Admitting: Gastroenterology

## 2014-06-25 ENCOUNTER — Ambulatory Visit
Admission: RE | Admit: 2014-06-25 | Discharge: 2014-06-25 | Disposition: A | Discharge status: Home / Self Care | Payer: Non-veteran care | Source: Ambulatory Visit | Attending: Gastroenterology | Admitting: Gastroenterology

## 2014-06-25 ENCOUNTER — Encounter
Admission: RE | Disposition: A | Discharge status: Home / Self Care | Payer: Self-pay | Source: Ambulatory Visit | Attending: Gastroenterology

## 2014-06-25 DIAGNOSIS — Z955 Presence of coronary angioplasty implant and graft: Secondary | ICD-10-CM | POA: Diagnosis not present

## 2014-06-25 DIAGNOSIS — Z809 Family history of malignant neoplasm, unspecified: Secondary | ICD-10-CM | POA: Diagnosis not present

## 2014-06-25 DIAGNOSIS — R6881 Early satiety: Secondary | ICD-10-CM | POA: Diagnosis not present

## 2014-06-25 DIAGNOSIS — K640 First degree hemorrhoids: Secondary | ICD-10-CM | POA: Insufficient documentation

## 2014-06-25 DIAGNOSIS — K449 Diaphragmatic hernia without obstruction or gangrene: Secondary | ICD-10-CM | POA: Diagnosis not present

## 2014-06-25 DIAGNOSIS — R634 Abnormal weight loss: Secondary | ICD-10-CM | POA: Diagnosis not present

## 2014-06-25 DIAGNOSIS — K2950 Unspecified chronic gastritis without bleeding: Secondary | ICD-10-CM | POA: Diagnosis not present

## 2014-06-25 DIAGNOSIS — Z972 Presence of dental prosthetic device (complete) (partial): Secondary | ICD-10-CM | POA: Insufficient documentation

## 2014-06-25 DIAGNOSIS — Z8249 Family history of ischemic heart disease and other diseases of the circulatory system: Secondary | ICD-10-CM | POA: Insufficient documentation

## 2014-06-25 DIAGNOSIS — R509 Fever, unspecified: Secondary | ICD-10-CM | POA: Diagnosis not present

## 2014-06-25 DIAGNOSIS — D509 Iron deficiency anemia, unspecified: Secondary | ICD-10-CM | POA: Insufficient documentation

## 2014-06-25 DIAGNOSIS — Z7982 Long term (current) use of aspirin: Secondary | ICD-10-CM | POA: Diagnosis not present

## 2014-06-25 DIAGNOSIS — E785 Hyperlipidemia, unspecified: Secondary | ICD-10-CM | POA: Insufficient documentation

## 2014-06-25 DIAGNOSIS — Z87891 Personal history of nicotine dependence: Secondary | ICD-10-CM | POA: Diagnosis not present

## 2014-06-25 DIAGNOSIS — K219 Gastro-esophageal reflux disease without esophagitis: Secondary | ICD-10-CM | POA: Diagnosis not present

## 2014-06-25 DIAGNOSIS — I10 Essential (primary) hypertension: Secondary | ICD-10-CM | POA: Insufficient documentation

## 2014-06-25 DIAGNOSIS — K259 Gastric ulcer, unspecified as acute or chronic, without hemorrhage or perforation: Secondary | ICD-10-CM | POA: Diagnosis not present

## 2014-06-25 DIAGNOSIS — Z79899 Other long term (current) drug therapy: Secondary | ICD-10-CM | POA: Insufficient documentation

## 2014-06-25 DIAGNOSIS — M1990 Unspecified osteoarthritis, unspecified site: Secondary | ICD-10-CM | POA: Diagnosis not present

## 2014-06-25 DIAGNOSIS — K5730 Diverticulosis of large intestine without perforation or abscess without bleeding: Secondary | ICD-10-CM | POA: Diagnosis not present

## 2014-06-25 DIAGNOSIS — K2970 Gastritis, unspecified, without bleeding: Secondary | ICD-10-CM | POA: Diagnosis not present

## 2014-06-25 HISTORY — DX: Gastro-esophageal reflux disease without esophagitis: K21.9

## 2014-06-25 HISTORY — DX: Complete loss of teeth, unspecified cause, unspecified class: Z97.2

## 2014-06-25 HISTORY — PX: ESOPHAGOGASTRODUODENOSCOPY: SHX5428

## 2014-06-25 HISTORY — DX: Unspecified osteoarthritis, unspecified site: M19.90

## 2014-06-25 HISTORY — DX: Hyperlipidemia, unspecified: E78.5

## 2014-06-25 HISTORY — DX: Complete loss of teeth, unspecified cause, unspecified class: K08.109

## 2014-06-25 HISTORY — DX: Essential (primary) hypertension: I10

## 2014-06-25 HISTORY — PX: COLONOSCOPY: SHX5424

## 2014-06-25 SURGERY — COLONOSCOPY
Anesthesia: Monitor Anesthesia Care | Wound class: Contaminated

## 2014-06-25 MED ORDER — LIDOCAINE HCL (CARDIAC) 20 MG/ML IV SOLN
INTRAVENOUS | Status: DC | PRN
  Administered 2014-06-25: 50 mg via INTRAVENOUS

## 2014-06-25 MED ORDER — GLYCOPYRROLATE 0.2 MG/ML IJ SOLN
INTRAMUSCULAR | Status: DC | PRN
  Administered 2014-06-25: .1 mg via INTRAVENOUS

## 2014-06-25 MED ORDER — PROPOFOL 10 MG/ML IV BOLUS
INTRAVENOUS | Status: DC | PRN
  Administered 2014-06-25: 20 mg via INTRAVENOUS
  Administered 2014-06-25 (×3): 50 mg via INTRAVENOUS
  Administered 2014-06-25: 30 mg via INTRAVENOUS
  Administered 2014-06-25: 20 mg via INTRAVENOUS
  Administered 2014-06-25 (×3): 30 mg via INTRAVENOUS
  Administered 2014-06-25 (×2): 20 mg via INTRAVENOUS

## 2014-06-25 MED ORDER — LACTATED RINGERS IV SOLN
INTRAVENOUS | Status: DC
  Administered 2014-06-25 (×2): via INTRAVENOUS

## 2014-06-25 MED ORDER — STERILE WATER FOR IRRIGATION IR SOLN
Status: DC | PRN
  Administered 2014-06-25: 09:00:00

## 2014-06-25 MED ORDER — SODIUM CHLORIDE 0.9 % IV SOLN: INTRAVENOUS | Status: DC

## 2014-06-25 SURGICAL SUPPLY — 38 items
BALLN DILATOR 10-12 8 (BALLOONS)
BALLN DILATOR 12-15 8 (BALLOONS)
BALLN DILATOR 15-18 8 (BALLOONS)
BALLN DILATOR CRE 0-12 8 (BALLOONS)
BALLN DILATOR ESOPH 8 10 CRE (MISCELLANEOUS) IMPLANT
BALLOON DILATOR 12-15 8 (BALLOONS) IMPLANT
BALLOON DILATOR 15-18 8 (BALLOONS) IMPLANT
BALLOON DILATOR CRE 0-12 8 (BALLOONS) IMPLANT
BLOCK BITE 60FR ADLT L/F GRN (MISCELLANEOUS) ×3 IMPLANT
CANISTER SUCT 1200ML W/VALVE (MISCELLANEOUS) ×3 IMPLANT
FCP ESCP3.2XJMB 240X2.8X (MISCELLANEOUS)
FORCEPS BIOP RAD 4 LRG CAP 4 (CUTTING FORCEPS) ×3 IMPLANT
FORCEPS BIOP RJ4 240 W/NDL (MISCELLANEOUS)
FORCEPS ESCP3.2XJMB 240X2.8X (MISCELLANEOUS) IMPLANT
GOWN CVR UNV OPN BCK APRN NK (MISCELLANEOUS) ×2 IMPLANT
GOWN ISOL THUMB LOOP REG UNIV (MISCELLANEOUS) ×4
HEMOCLIP INSTINCT (CLIP) IMPLANT
INJECTOR VARIJECT VIN23 (MISCELLANEOUS) IMPLANT
KIT CO2 TUBING (TUBING) ×3 IMPLANT
KIT DEFENDO VALVE AND CONN (KITS) IMPLANT
KIT ENDO PROCEDURE OLY (KITS) ×3 IMPLANT
LIGATOR MULTIBAND 6SHOOTER MBL (MISCELLANEOUS) IMPLANT
MARKER SPOT ENDO TATTOO 5ML (MISCELLANEOUS) IMPLANT
PAD GROUND ADULT SPLIT (MISCELLANEOUS) IMPLANT
SNARE SHORT THROW 13M SML OVAL (MISCELLANEOUS) IMPLANT
SNARE SHORT THROW 30M LRG OVAL (MISCELLANEOUS) IMPLANT
SPOT EX ENDOSCOPIC TATTOO (MISCELLANEOUS)
SUCTION POLY TRAP 4CHAMBER (MISCELLANEOUS) IMPLANT
SYR INFLATION 60ML (SYRINGE) IMPLANT
TRAP SUCTION POLY (MISCELLANEOUS) IMPLANT
TUBING CONN 6MMX3.1M (TUBING)
TUBING SUCTION CONN 0.25 STRL (TUBING) IMPLANT
UNDERPAD 30X60 958B10 (PK) (MISCELLANEOUS) IMPLANT
VALVE BIOPSY ENDO (VALVE) IMPLANT
VARIJECT INJECTOR VIN23 (MISCELLANEOUS)
WATER AUXILLARY (MISCELLANEOUS) IMPLANT
WATER STERILE IRR 500ML POUR (IV SOLUTION) ×3 IMPLANT
WIRE CRE 18-20MM 8CM F G (MISCELLANEOUS) IMPLANT

## 2014-06-25 NOTE — Op Note (Signed)
[facility] Gastroenterology Patient Name: Donald [name] Procedure Date: 06/25/2014 8:42 AM MRN: [id number] Account #: [id number] Date of Birth: March 16, 1945 Admit Type: Outpatient Age: 69 Room: [facility] OR ROOM 01 Gender: Male Note Status: Finalized Procedure:         Colonoscopy Indications:       Iron deficiency anemia, Weight loss Providers:         Lucilla Lame, MD Referring MD:      Juline Patch, MD (Referring MD) Medicines:         Propofol per Anesthesia Complications:     No immediate complications. Procedure:         Pre-Anesthesia Assessment:                    - Prior to the procedure, a History and Physical was                     performed, and patient medications and allergies were                     reviewed. The patient's tolerance of previous anesthesia                     was also reviewed. The risks and benefits of the procedure                     and the sedation options and risks were discussed with the                     patient. All questions were answered, and informed consent                     was obtained. Prior Anticoagulants: The patient has taken                     no previous anticoagulant or antiplatelet agents. ASA                     Grade Assessment: II - A patient with mild systemic                     disease. After reviewing the risks and benefits, the                     patient was deemed in satisfactory condition to undergo                     the procedure.                    After obtaining informed consent, the colonoscope was                     passed under direct vision. Throughout the procedure, the                     patient's blood pressure, pulse, and oxygen saturations                     were monitored continuously. The Olympus CF-HQ190L                     Colonoscope (S#. [id number]) was introduced through the anus  and advanced to the the cecum, identified by appendiceal   orifice and ileocecal valve. The colonoscopy was performed                     without difficulty. The patient tolerated the procedure                     well. The quality of the bowel preparation was excellent. Findings:      The perianal and digital rectal examinations were normal.      A few small-mouthed diverticula were found in the entire colon.      Non-bleeding internal hemorrhoids were found during retroflexion. The       hemorrhoids were Grade I (internal hemorrhoids that do not prolapse). Impression:        - Diverticulosis in the entire examined colon.                    - Non-bleeding internal hemorrhoids.                    - No specimens collected. Recommendation:    - Return to my office in 2 weeks. Procedure Code(s): --- Professional ---                    [phone], Colonoscopy, flexible; diagnostic, including                     collection of specimen(s) by brushing or washing, when                     performed (separate procedure) Diagnosis Code(s): --- Professional ---                    D50.9, Iron deficiency anemia, unspecified                    R63.4, Abnormal weight loss CPT copyright 2014 American Medical Association. All rights reserved. The codes documented in this report are preliminary and upon coder review may  be revised to meet current compliance requirements. Lucilla Lame, MD 06/25/2014 9:13:47 AM This report has been signed electronically. Number of Addenda: 0 Note Initiated On: 06/25/2014 8:42 AM Scope Withdrawal Time: 0 hours 4 minutes 54 seconds  Total Procedure Duration: 0 hours 8 minutes 57 seconds       [facility]

## 2014-06-25 NOTE — Op Note (Signed)
[facility] Gastroenterology Patient Name: Donald [name] Procedure Date: 06/25/2014 8:43 AM MRN: [id number] Account #: [id number] Date of Birth: Mar 04, 1945 Admit Type: Outpatient Age: 69 Room: [facility] OR ROOM 01 Gender: Male Note Status: Finalized Procedure:         Upper GI endoscopy Indications:       Iron deficiency anemia, Early satiety Providers:         Lucilla Lame, MD Referring MD:      Juline Patch, MD (Referring MD) Medicines:         Propofol per Anesthesia Complications:     No immediate complications. Procedure:         Pre-Anesthesia Assessment:                    - Prior to the procedure, a History and Physical was                     performed, and patient medications and allergies were                     reviewed. The patient's tolerance of previous anesthesia                     was also reviewed. The risks and benefits of the procedure                     and the sedation options and risks were discussed with the                     patient. All questions were answered, and informed consent                     was obtained. Prior Anticoagulants: The patient has taken                     no previous anticoagulant or antiplatelet agents. ASA                     Grade Assessment: III - A patient with severe systemic                     disease. After reviewing the risks and benefits, the                     patient was deemed in satisfactory condition to undergo                     the procedure.                    After obtaining informed consent, the endoscope was passed                     under direct vision. Throughout the procedure, the                     patient's blood pressure, pulse, and oxygen saturations                     were monitored continuously. The Olympus GIF-HQ190                     Endoscope (S#. [id number]) was introduced through the mouth,  and advanced to the second part of duodenum. The upper GI              endoscopy was accomplished without difficulty. The patient                     tolerated the procedure well. Findings:      A small hiatus hernia was present.      One non-bleeding cratered gastric ulcer with no stigmata of bleeding was       found in the gastric antrum. Biopsies were taken with a cold forceps for       histology.      The examined duodenum was normal. Impression:        - Small hiatus hernia.                    - Gastric ulcer with clean base. Biopsied.                    - Normal examined duodenum. Recommendation:    - Await pathology results.                    - Perform a colonoscopy today. Procedure Code(s): --- Professional ---                    [phone], Esophagogastroduodenoscopy, flexible, transoral;                     with biopsy, single or multiple Diagnosis Code(s): --- Professional ---                    D50.9, Iron deficiency anemia, unspecified                    R68.81, Early satiety                    K44.9, Diaphragmatic hernia without obstruction or gangrene                    K25.9, Gastric ulcer, unspecified as acute or chronic,                     without hemorrhage or perforation CPT copyright 2014 American Medical Association. All rights reserved. The codes documented in this report are preliminary and upon coder review may  be revised to meet current compliance requirements. Lucilla Lame, MD 06/25/2014 9:00:26 AM This report has been signed electronically. Number of Addenda: 0 Note Initiated On: 06/25/2014 8:43 AM      [facility]

## 2014-06-25 NOTE — Anesthesia Preprocedure Evaluation (Signed)
Anesthesia Evaluation  Patient identified by MRN, date of birth, ID band Patient awake    Airway Mallampati: II  TM Distance: >3 FB Neck ROM: Full    Dental   Pulmonary former smoker,          Cardiovascular hypertension, + CAD and + Cardiac Stents     Neuro/Psych    GI/Hepatic   Endo/Other    Renal/GU      Musculoskeletal   Abdominal   Peds  Hematology   Anesthesia Other Findings   Reproductive/Obstetrics                             Anesthesia Physical Anesthesia Plan  ASA: III  Anesthesia Plan: MAC   Post-op Pain Management:    Induction:   Airway Management Planned:   Additional Equipment:   Intra-op Plan:   Post-operative Plan:   Informed Consent: I have reviewed the patients History and Physical, chart, labs and discussed the procedure including the risks, benefits and alternatives for the proposed anesthesia with the patient or authorized representative who has indicated his/her understanding and acceptance.     Plan Discussed with:   Anesthesia Plan Comments:         Anesthesia Quick Evaluation

## 2014-06-25 NOTE — Transfer of Care (Signed)
Immediate Anesthesia Transfer of Care Note  Patient: Donald [name]  Procedure(s) Performed: Procedure(s) with comments: COLONOSCOPY (N/A) - cecum time- 0906 ESOPHAGOGASTRODUODENOSCOPY (EGD) (N/A)  Patient Location: PACU  Anesthesia Type: MAC  Level of Consciousness: awake, alert  and patient cooperative  Airway and Oxygen Therapy: Patient Spontanous Breathing and Patient connected to supplemental oxygen  Post-op Assessment: Post-op Vital signs reviewed, Patient's Cardiovascular Status Stable, Respiratory Function Stable, Patent Airway and No signs of Nausea or vomiting  Post-op Vital Signs: Reviewed and stable  Complications: No apparent anesthesia complications

## 2014-06-25 NOTE — Anesthesia Postprocedure Evaluation (Signed)
  Anesthesia Post-op Note  Patient: Donald [name]  Procedure(s) Performed: Procedure(s) with comments: COLONOSCOPY (N/A) - cecum time- 0906 ESOPHAGOGASTRODUODENOSCOPY (EGD) (N/A)  Anesthesia type:MAC  Patient location: PACU  Post pain: Pain level controlled  Post assessment: Post-op Vital signs reviewed, Patient's Cardiovascular Status Stable, Respiratory Function Stable, Patent Airway and No signs of Nausea or vomiting  Post vital signs: Reviewed and stable  Last Vitals:  Filed Vitals:   06/25/14 0915  BP: 120/74  Pulse: 85  Temp: 36.5 C  Resp:     Level of consciousness: awake, alert  and patient cooperative  Complications: No apparent anesthesia complications

## 2014-06-25 NOTE — H&P (Signed)
[facility]  [address], [place], [place] [address] Phone: [phone] Fax : [phone]  Primary Care Physician:  Otilio Miu, MD Primary Gastroenterologist:  Dr. Allen Norris  Pre-Procedure History & Physical: HPI:  Donald [name] is a 69 y.o. male is here for an endoscopy and colonoscopy.   Past Medical History  Diagnosis Date  . Hypertension   . GERD (gastroesophageal reflux disease)   . Hyperlipidemia   . Arthritis   . Full dentures     upper and lower    Past Surgical History  Procedure Laterality Date  . Back surgery  [id number]  . Coronary angioplasty with stent placement  2006    Prior to Admission medications   Medication Sig Start Date End Date Taking? Authorizing Provider  alfuzosin (UROXATRAL) 10 MG 24 hr tablet Take 10 mg by mouth at bedtime.   Yes Historical Provider, MD  amLODipine (NORVASC) 10 MG tablet Take 10 mg by mouth daily. AM   Yes Historical Provider, MD  Ascorbic Acid (VITAMIN C) 1000 MG tablet Take 1,000 mg by mouth 3 (three) times daily.    Yes Historical Provider, MD  aspirin 81 MG tablet Take 81 mg by mouth daily. AM   Yes Historical Provider, MD  atorvastatin (LIPITOR) 80 MG tablet Take 80 mg by mouth daily. AM   Yes Historical Provider, MD  Calcium Carbonate (CALCIUM 600 PO) Take by mouth.   Yes Historical Provider, MD  carvedilol (COREG) 3.125 MG tablet Take 3.125 mg by mouth 2 (two) times daily with a meal.   Yes Historical Provider, MD  clonazePAM (KLONOPIN) 2 MG tablet Take 2 mg by mouth as needed. PM   Yes Historical Provider, MD  diclofenac (VOLTAREN) 75 MG EC tablet Take 75 mg by mouth 2 (two) times daily.   Yes Historical Provider, MD  ferrous sulfate 325 (65 FE) MG tablet Take 325 mg by mouth 3 (three) times daily with meals.    Yes Historical Provider, MD  HYDROcodone-acetaminophen (NORCO/VICODIN) 5-325 MG per tablet Take 1 tablet by mouth every 6 (six) hours as needed for moderate pain.   Yes Historical  Provider, MD  LACTOBACILLUS EXTRA STRENGTH PO Take 2 tablets by mouth daily. AM   Yes Historical Provider, MD  lisinopril (PRINIVIL,ZESTRIL) 20 MG tablet Take 20 mg by mouth. AM   Yes Historical Provider, MD  Omega-3 Fatty Acids (FISH OIL) 1200 MG CPDR Take 1 Dose by mouth daily. AM   Yes Historical Provider, MD  omeprazole (PRILOSEC) 20 MG capsule Take 20 mg by mouth daily. AM   Yes Historical Provider, MD    Allergies as of 06/16/2014  . (Not on File)    History reviewed. No pertinent family history.  History   Social History  . Marital Status: Married    Spouse Name: N/A  . Number of Children: N/A  . Years of Education: N/A   Occupational History  . Not on file.   Social History Main Topics  . Smoking status: Former Smoker    Quit date: 02/06/1993  . Smokeless tobacco: Not on file  . Alcohol Use: No  . Drug Use: Not on file  . Sexual Activity: Not on file   Other Topics Concern  . Not on file   Social History Narrative    Review of Systems: See HPI, otherwise negative ROS  Physical Exam: BP 144/81 mmHg  Pulse 86  Temp(Src) 97.9 F (36.6 C) (Temporal)  Resp 16  Ht 5\' 8"  (1.727 m)  Wt 120 lb (54.432 kg)  BMI 18.25 kg/m2  SpO2 99% General:   Alert,  pleasant and cooperative in NAD Head:  Normocephalic and atraumatic. Neck:  Supple; no masses or thyromegaly. Lungs:  Clear throughout to auscultation.    Heart:  Regular rate and rhythm. Abdomen:  Soft, nontender and nondistended. Normal bowel sounds, without guarding, and without rebound.   Neurologic:  Alert and  oriented x4;  grossly normal neurologically.  Impression/Plan: Donald [name] is here for an endoscopy and colonoscopy to be performed for Weight loss early satiety and anemia  Risks, benefits, limitations, and alternatives regarding  endoscopy and colonoscopy have been reviewed with the patient.  Questions have been answered.  All parties agreeable.   [facility], MD  06/25/2014, 8:39 AM

## 2014-06-27 ENCOUNTER — Encounter: Payer: Self-pay | Admitting: Gastroenterology

## 2014-07-01 ENCOUNTER — Other Ambulatory Visit: Payer: Self-pay | Admitting: Gastroenterology

## 2014-07-01 DIAGNOSIS — R160 Hepatomegaly, not elsewhere classified: Secondary | ICD-10-CM

## 2014-07-03 ENCOUNTER — Ambulatory Visit: Payer: Medicare Other

## 2014-07-07 ENCOUNTER — Ambulatory Visit
Admission: RE | Admit: 2014-07-07 | Discharge: 2014-07-07 | Disposition: A | Discharge status: Home / Self Care | Payer: Non-veteran care | Source: Ambulatory Visit | Attending: Gastroenterology | Admitting: Gastroenterology

## 2014-07-07 DIAGNOSIS — R160 Hepatomegaly, not elsewhere classified: Secondary | ICD-10-CM | POA: Insufficient documentation

## 2014-07-07 DIAGNOSIS — Z9049 Acquired absence of other specified parts of digestive tract: Secondary | ICD-10-CM | POA: Diagnosis not present

## 2014-07-08 ENCOUNTER — Ambulatory Visit: Payer: Self-pay | Admitting: Gastroenterology

## 2014-07-14 ENCOUNTER — Ambulatory Visit (INDEPENDENT_AMBULATORY_CARE_PROVIDER_SITE_OTHER): Payer: Non-veteran care | Admitting: Gastroenterology

## 2014-07-14 ENCOUNTER — Other Ambulatory Visit: Payer: Self-pay

## 2014-07-14 VITALS — BP 121/66 | HR 62 | Temp 98.3°F | Ht 68.0 in | Wt 125.0 lb

## 2014-07-14 DIAGNOSIS — M19019 Primary osteoarthritis, unspecified shoulder: Secondary | ICD-10-CM | POA: Diagnosis not present

## 2014-07-14 DIAGNOSIS — R1013 Epigastric pain: Secondary | ICD-10-CM | POA: Diagnosis not present

## 2014-07-14 DIAGNOSIS — E785 Hyperlipidemia, unspecified: Secondary | ICD-10-CM | POA: Diagnosis not present

## 2014-07-14 DIAGNOSIS — D509 Iron deficiency anemia, unspecified: Secondary | ICD-10-CM

## 2014-07-14 DIAGNOSIS — I10 Essential (primary) hypertension: Secondary | ICD-10-CM | POA: Insufficient documentation

## 2014-07-14 DIAGNOSIS — K253 Acute gastric ulcer without hemorrhage or perforation: Secondary | ICD-10-CM

## 2014-07-14 DIAGNOSIS — I159 Secondary hypertension, unspecified: Secondary | ICD-10-CM

## 2014-07-14 DIAGNOSIS — I158 Other secondary hypertension: Secondary | ICD-10-CM

## 2014-07-14 DIAGNOSIS — K219 Gastro-esophageal reflux disease without esophagitis: Secondary | ICD-10-CM

## 2014-07-14 DIAGNOSIS — M1990 Unspecified osteoarthritis, unspecified site: Secondary | ICD-10-CM | POA: Insufficient documentation

## 2014-07-14 DIAGNOSIS — K259 Gastric ulcer, unspecified as acute or chronic, without hemorrhage or perforation: Secondary | ICD-10-CM | POA: Insufficient documentation

## 2014-07-14 NOTE — Progress Notes (Signed)
Primary Care Physician: Otilio Miu, MD  Primary Gastroenterologist:  Dr. Lucilla Lame  Chief Complaint  Patient presents with  . f/u procedures    HPI: Donald [name] is a 69 y.o. male here after having an EGD and colonoscopy. The patient was found to have a gastric ulcer. The patient did not have any sign of the cause of his anemia except for the ulcer. Easily had a fall. He also states that he eats fast and has a lot of bloating and abdominal distention after eating the patient also wants to know if he can stop his iron tablets. Re-on omeprazole 20 mg twice a day.  Current Outpatient Prescriptions  Medication Sig Dispense Refill  . alfuzosin (UROXATRAL) 10 MG 24 hr tablet Take 10 mg by mouth at bedtime.    Marland Kitchen amLODipine (NORVASC) 10 MG tablet Take 10 mg by mouth daily. AM    . Ascorbic Acid (VITAMIN C) 1000 MG tablet Take 1,000 mg by mouth 3 (three) times daily.     Marland Kitchen aspirin 81 MG tablet Take 81 mg by mouth daily. AM    . atorvastatin (LIPITOR) 80 MG tablet Take 80 mg by mouth daily. AM    . carvedilol (COREG) 3.125 MG tablet Take 3.125 mg by mouth 2 (two) times daily with a meal.    . clonazePAM (KLONOPIN) 2 MG tablet Take 2 mg by mouth as needed. PM    . diclofenac (VOLTAREN) 75 MG EC tablet Take 75 mg by mouth 2 (two) times daily.    . ferrous sulfate 325 (65 FE) MG tablet Take 325 mg by mouth 3 (three) times daily with meals.     Marland Kitchen HYDROcodone-acetaminophen (NORCO/VICODIN) 5-325 MG per tablet Take 1 tablet by mouth every 6 (six) hours as needed for moderate pain.    Marland Kitchen LACTOBACILLUS EXTRA STRENGTH PO Take 2 tablets by mouth daily. AM    . lisinopril (PRINIVIL,ZESTRIL) 20 MG tablet Take 20 mg by mouth. AM    . Omega-3 Fatty Acids (FISH OIL) 1200 MG CPDR Take 1 Dose by mouth daily. AM    . omeprazole (PRILOSEC) 20 MG capsule Take 20 mg by mouth daily. AM    . Calcium Carbonate (CALCIUM 600 PO) Take by mouth.     No current facility-administered medications for this visit.     Allergies as of 07/14/2014 - Review Complete 07/14/2014  Allergen Reaction Noted  . Ciprofloxacin Nausea Only 07/14/2014    ROS:  General: Negative for anorexia, weight loss, fever, chills, fatigue, weakness. ENT: Negative for hoarseness, difficulty swallowing , nasal congestion. CV: Negative for chest pain, angina, palpitations, dyspnea on exertion, peripheral edema.  Respiratory: Negative for dyspnea at rest, dyspnea on exertion, cough, sputum, wheezing.  GI: See history of present illness. GU:  Negative for dysuria, hematuria, urinary incontinence, urinary frequency, nocturnal urination.  Endo: Negative for unusual weight change.    Physical Examination:   BP 121/66 mmHg  Pulse 62  Temp(Src) 98.3 F (36.8 C) (Oral)  Ht 5\' 8"  (1.727 m)  Wt 125 lb (56.7 kg)  BMI 19.01 kg/m2  General: Well-nourished, well-developed in no acute distress.  Eyes: No icterus. Conjunctivae pink. Mouth: Oropharyngeal mucosa moist and pink , no lesions erythema or exudate. Lungs: Clear to auscultation bilaterally. Non-labored. Heart: Regular rate and rhythm, no murmurs rubs or gallops.  Abdomen: Bowel sounds are normal, nontender, nondistended, no hepatosplenomegaly or masses, no abdominal bruits or hernia , no rebound or guarding.   Extremities: No lower extremity  edema. No clubbing or deformities. Neuro: Alert and oriented x 3.  Grossly intact. Skin: Warm and dry, no jaundice.   Psych: Alert and cooperative, normal mood and affect.    Imaging Studies: US Abdomen Limited Ruq  07/08/14   CLINICAL DATA:  Hepatomegaly.  History of cholecystectomy.  EXAM: US ABDOMEN LIMITED - RIGHT UPPER QUADRANT  COMPARISON:  Abdominal CT 05/26/2013.  FINDINGS: Gallbladder:  Status post cholecystectomy.  Common bile duct:  Diameter: 5.5 mm.  No evidence of choledocholithiasis.  Liver:  There is a stable cyst in the inferior right hepatic lobe adjacent to the cholecystectomy bed, measuring 1.8 x 0.8 x 1.7 cm. No  other focal hepatic lesions identified. The liver is subjectively normal in size but measures up to 17 cm in length.  IMPRESSION: No acute findings status post cholecystectomy. Borderline hepatomegaly.   Electronically Signed   By: Richardean Sale M.D.   On: 07-08-2014 09:56

## 2014-07-14 NOTE — Assessment & Plan Note (Signed)
This patient is a 69 year old gentleman who underwent an EGD and colonoscopy for anemia. The patient was found to have a gastric ulcer. The patient will be switched over to a short trial of the excellent to see if it helps his symptoms. The patient also is on and then found her medications for pain and has been told that this medication will keep the acid down so that the entire inflammatory medication will not cause him as much stomach damage. The patient also has been told to start eating slower. He will follow-up with me if his symptoms worsen and he will also have his blood checked for a CBC today.

## 2014-07-15 LAB — CBC WITH DIFFERENTIAL/PLATELET
BASOS ABS: 0 10*3/uL (ref 0.0–0.2)
Basos: 1 %
EOS (ABSOLUTE): 0.1 10*3/uL (ref 0.0–0.4)
Eos: 2 %
Hematocrit: 32.9 % — ABNORMAL LOW (ref 37.5–51.0)
Hemoglobin: 10.3 g/dL — ABNORMAL LOW (ref 12.6–17.7)
IMMATURE GRANULOCYTES: 0 %
Immature Grans (Abs): 0 10*3/uL (ref 0.0–0.1)
LYMPHS ABS: 2.1 10*3/uL (ref 0.7–3.1)
LYMPHS: 31 %
MCH: 25.2 pg — ABNORMAL LOW (ref 26.6–33.0)
MCHC: 31.3 g/dL — AB (ref 31.5–35.7)
MCV: 81 fL (ref 79–97)
Monocytes Absolute: 0.6 10*3/uL (ref 0.1–0.9)
Monocytes: 8 %
NEUTROS ABS: 4 10*3/uL (ref 1.4–7.0)
Neutrophils: 58 %
PLATELETS: 209 10*3/uL (ref 150–379)
RBC: 4.08 x10E6/uL — ABNORMAL LOW (ref 4.14–5.80)
RDW: 19.4 % — AB (ref 12.3–15.4)
WBC: 6.9 10*3/uL (ref 3.4–10.8)

## 2014-07-22 ENCOUNTER — Telehealth: Payer: Self-pay

## 2014-07-22 NOTE — Telephone Encounter (Signed)
Pt notified of results. Will call pt back in 2 weeks to repeat CBC.

## 2014-07-22 NOTE — Telephone Encounter (Signed)
-----   Message from Lucilla Lame, MD sent at 07/20/2014  8:38 AM EDT ----- Let the patient know his blood count showed a Hb of 10 with Hct of 32.9. Repeat CBC in 2 weeks

## 2014-08-03 ENCOUNTER — Other Ambulatory Visit: Payer: Self-pay

## 2014-08-03 DIAGNOSIS — K219 Gastro-esophageal reflux disease without esophagitis: Secondary | ICD-10-CM

## 2014-08-04 ENCOUNTER — Telehealth: Payer: Self-pay | Admitting: Gastroenterology

## 2014-08-04 NOTE — Telephone Encounter (Signed)
Per pt, I refaxed rx to the [place] pharmacy to the attn: Roselind Messier, [place] [phone].

## 2014-08-04 NOTE — Telephone Encounter (Signed)
Heather from the [place] called regarding Donald [name]. Please refax RX to [phone] to the attn: Dan Humphreys

## 2014-08-04 NOTE — Telephone Encounter (Signed)
Beth from the [facility] would like to speak to you regarding the RX Dexilent [place] x [phone]

## 2014-08-11 NOTE — Telephone Encounter (Signed)
Spoke with Donald [name] at the [facility] regarding pt's rx for [place]. [facility] would like him to try Protonix prior to approving the [place]. Faxed over a rx for Protonix 40mg . Advised pt of the situation. He is okay with with switch for now. Dexilant is working very well for him and would like to continue once trial has been done. Pt has an appt in July with his Dr and will discuss the issue at that time.

## 2014-08-12 ENCOUNTER — Telehealth: Payer: Self-pay | Admitting: Gastroenterology

## 2014-08-12 NOTE — Telephone Encounter (Signed)
Peyton BottomsHenrene Pastor told Mendel Ryder from [facility] [phone] to call you regarding DOS 06/25/2014

## 2014-08-13 NOTE — Telephone Encounter (Signed)
Spoke with Mendel Ryder from [facility]. Faxed her the authorization paperwork for pt's procedures from the [place] to [phone].

## 2014-11-30 ENCOUNTER — Other Ambulatory Visit: Payer: Self-pay

## 2015-10-19 ENCOUNTER — Encounter: Payer: Self-pay | Admitting: Family Medicine

## 2015-10-19 ENCOUNTER — Ambulatory Visit (INDEPENDENT_AMBULATORY_CARE_PROVIDER_SITE_OTHER): Payer: Non-veteran care | Admitting: Family Medicine

## 2015-10-19 VITALS — BP 150/70 | HR 72 | Temp 98.5°F | Ht 69.0 in | Wt 123.0 lb

## 2015-10-19 DIAGNOSIS — K5732 Diverticulitis of large intestine without perforation or abscess without bleeding: Secondary | ICD-10-CM

## 2015-10-19 LAB — POCT URINALYSIS DIPSTICK
Bilirubin, UA: NEGATIVE
Blood, UA: NEGATIVE
Glucose, UA: NEGATIVE
Ketones, UA: NEGATIVE
LEUKOCYTES UA: NEGATIVE
NITRITE UA: NEGATIVE
PH UA: 5
PROTEIN UA: NEGATIVE
Spec Grav, UA: 1.015
Urobilinogen, UA: 0.2

## 2015-10-19 MED ORDER — METRONIDAZOLE 500 MG PO TABS: 500.0000 mg | ORAL_TABLET | Freq: Three times a day (TID) | ORAL | 0 refills | Status: DC

## 2015-10-19 MED ORDER — MUPIROCIN 2 % EX OINT: 1.0000 "application " | TOPICAL_OINTMENT | Freq: Two times a day (BID) | CUTANEOUS | 0 refills | Status: DC

## 2015-10-19 MED ORDER — AMOXICILLIN-POT CLAVULANATE 875-125 MG PO TABS: 1.0000 | ORAL_TABLET | Freq: Two times a day (BID) | ORAL | 0 refills | Status: DC

## 2015-10-19 NOTE — Progress Notes (Signed)
Name: Donald [name]   MRN: [id number]    DOB: [birth year]   Date:10/19/2015       Progress Note  Subjective  Chief Complaint  Chief Complaint  Patient presents with  . Diverticulitis    L) abdominal pain- bloating and a faint pain that is just "uncomfortable"    Abdominal Pain  This is a new problem. The current episode started in the past 7 days. The onset quality is gradual. The problem occurs intermittently. The problem has been gradually worsening. The pain is located in the suprapubic region. The pain is at a severity of 3/10. The quality of the pain is aching. The abdominal pain does not radiate. Pertinent negatives include no anorexia, arthralgias, constipation, diarrhea, dysuria, fever, frequency, headaches, hematochezia, hematuria, melena, myalgias, nausea or weight loss. Associated symptoms comments: bloating. The pain is aggravated by NSAIDs. He has tried nothing for the symptoms.  Insomnia  Primary symptoms: fragmented sleep, no malaise/fatigue.  The onset quality is sudden. PMH includes: no depression.    No problem-specific Assessment & Plan notes found for this encounter.   Past Medical History:  Diagnosis Date  . Arthritis    Osteoarthritis  . Full dentures    upper and lower  . GERD (gastroesophageal reflux disease)   . Hyperlipidemia   . Hypertension     Past Surgical History:  Procedure Laterality Date  . BACK SURGERY  [phone]  . COLONOSCOPY N/A 06/25/2014   Procedure: COLONOSCOPY;  Surgeon: Lucilla Lame, MD;  Location: [facility];  Service: Gastroenterology;  Laterality: N/A;  cecum time- 0906  . CORONARY ANGIOPLASTY WITH STENT PLACEMENT  2006  . ESOPHAGOGASTRODUODENOSCOPY N/A 06/25/2014   Procedure: ESOPHAGOGASTRODUODENOSCOPY (EGD);  Surgeon: Lucilla Lame, MD;  Location: [facility];  Service: Gastroenterology;  Laterality: N/A;  . FRACTURE SURGERY      No family history on file.  Social History   Social History  . Marital  status: Married    Spouse name: N/A  . Number of children: N/A  . Years of education: N/A   Occupational History  . Not on file.   Social History Main Topics  . Smoking status: Former Smoker    Quit date: 02/06/1993  . Smokeless tobacco: Not on file  . Alcohol use No  . Drug use: Unknown  . Sexual activity: Not on file   Other Topics Concern  . Not on file   Social History Narrative  . No narrative on file    Allergies  Allergen Reactions  . Ciprofloxacin Nausea Only     Review of Systems  Constitutional: Negative for chills, fever, malaise/fatigue and weight loss.  HENT: Negative for ear discharge, ear pain and sore throat.   Eyes: Negative for blurred vision.  Respiratory: Negative for cough, sputum production, shortness of breath and wheezing.   Cardiovascular: Negative for chest pain, palpitations and leg swelling.  Gastrointestinal: Positive for abdominal pain. Negative for anorexia, blood in stool, constipation, diarrhea, heartburn, hematochezia, melena and nausea.  Genitourinary: Negative for dysuria, frequency, hematuria and urgency.  Musculoskeletal: Negative for arthralgias, back pain, joint pain, myalgias and neck pain.  Skin: Negative for rash.  Neurological: Negative for dizziness, tingling, sensory change, focal weakness and headaches.  Endo/Heme/Allergies: Negative for environmental allergies and polydipsia. Does not bruise/bleed easily.  Psychiatric/Behavioral: Negative for depression and suicidal ideas. The patient has insomnia. The patient is not nervous/anxious.      Objective  Vitals:   10/19/15 1331  BP: (!) 150/70  Pulse: 72  Temp: 98.5 F (36.9 C)  TempSrc: Oral  Weight: 123 lb (55.8 kg)  Height: 5\' 9"  (1.753 m)    Physical Exam  Constitutional: He is oriented to person, place, and time and well-developed, well-nourished, and in no distress.  HENT:  Head: Normocephalic.  Right Ear: External ear normal.  Left Ear: External ear normal.   Nose: Nose normal.  Mouth/Throat: Oropharynx is clear and moist.  Eyes: Conjunctivae and EOM are normal. Pupils are equal, round, and reactive to light. Right eye exhibits no discharge. Left eye exhibits no discharge. No scleral icterus.  Neck: Normal range of motion. Neck supple. No JVD present. No tracheal deviation present. No thyromegaly present.  Cardiovascular: Normal rate, regular rhythm, normal heart sounds and intact distal pulses.  Exam reveals no gallop and no friction rub.   No murmur heard. Pulmonary/Chest: Breath sounds normal. No respiratory distress. He has no wheezes. He has no rales.  Abdominal: Soft. He exhibits no mass. Bowel sounds are not hypoactive. There is no hepatosplenomegaly. There is tenderness in the suprapubic area. There is no rebound, no guarding and no CVA tenderness.  Musculoskeletal: Normal range of motion. He exhibits no edema or tenderness.  Lymphadenopathy:    He has no cervical adenopathy.  Neurological: He is alert and oriented to person, place, and time. He has normal sensation, normal strength, normal reflexes and intact cranial nerves. No cranial nerve deficit.  Skin: Skin is warm. No rash noted.  Psychiatric: Mood and affect normal.  Nursing note and vitals reviewed.     Assessment & Plan  Problem List Items Addressed This Visit    None    Visit Diagnoses    Diverticulitis of large intestine without perforation or abscess without bleeding    -  Primary   Relevant Medications   metroNIDAZOLE (FLAGYL) 500 MG tablet   amoxicillin-clavulanate (AUGMENTIN) 875-125 MG tablet   Other Relevant Orders   CBC   POCT urinalysis dipstick (Completed)        Dr. Mac[facility]  10/19/15

## 2015-10-20 LAB — CBC
HEMOGLOBIN: 8.6 g/dL — AB (ref 12.6–17.7)
Hematocrit: 28.9 % — ABNORMAL LOW (ref 37.5–51.0)
MCH: 23.6 pg — ABNORMAL LOW (ref 26.6–33.0)
MCHC: 29.8 g/dL — ABNORMAL LOW (ref 31.5–35.7)
MCV: 79 fL (ref 79–97)
PLATELETS: 280 10*3/uL (ref 150–379)
RBC: 3.64 x10E6/uL — AB (ref 4.14–5.80)
RDW: 15.2 % (ref 12.3–15.4)
WBC: 5.6 10*3/uL (ref 3.4–10.8)

## 2015-11-16 ENCOUNTER — Encounter: Payer: Self-pay | Admitting: Family Medicine

## 2015-11-16 ENCOUNTER — Ambulatory Visit (INDEPENDENT_AMBULATORY_CARE_PROVIDER_SITE_OTHER): Payer: Medicare Other | Admitting: Family Medicine

## 2015-11-16 VITALS — BP 138/58 | HR 79 | Ht 69.0 in | Wt 130.0 lb

## 2015-11-16 DIAGNOSIS — G629 Polyneuropathy, unspecified: Secondary | ICD-10-CM | POA: Diagnosis not present

## 2015-11-16 DIAGNOSIS — D649 Anemia, unspecified: Secondary | ICD-10-CM | POA: Diagnosis not present

## 2015-11-16 MED ORDER — GABAPENTIN 100 MG PO CAPS: 100.0000 mg | ORAL_CAPSULE | Freq: Three times a day (TID) | ORAL | 1 refills | Status: DC

## 2015-11-16 NOTE — Progress Notes (Signed)
Name: Donald [name]   MRN: [id number]    DOB: November 03, 1945   Date:11/16/2015       Progress Note  Subjective  Chief Complaint  Chief Complaint  Patient presents with  . nerve pain    burning and feels like pins and needles all over    Nerve pain last month.  Generalized "pins and needles". Happens once a year.  Pt desires prednisone/ or antibiotics.    No problem-specific Assessment & Plan notes found for this encounter.   Past Medical History:  Diagnosis Date  . Arthritis    Osteoarthritis  . Full dentures    upper and lower  . GERD (gastroesophageal reflux disease)   . Hyperlipidemia   . Hypertension     Past Surgical History:  Procedure Laterality Date  . BACK SURGERY  [phone]  . COLONOSCOPY N/A 06/25/2014   Procedure: COLONOSCOPY;  Surgeon: Lucilla Lame, MD;  Location: [facility];  Service: Gastroenterology;  Laterality: N/A;  cecum time- 0906  . CORONARY ANGIOPLASTY WITH STENT PLACEMENT  2006  . ESOPHAGOGASTRODUODENOSCOPY N/A 06/25/2014   Procedure: ESOPHAGOGASTRODUODENOSCOPY (EGD);  Surgeon: Lucilla Lame, MD;  Location: [facility];  Service: Gastroenterology;  Laterality: N/A;  . FRACTURE SURGERY      No family history on file.  Social History   Social History  . Marital status: Married    Spouse name: N/A  . Number of children: N/A  . Years of education: N/A   Occupational History  . Not on file.   Social History Main Topics  . Smoking status: Former Smoker    Quit date: 02/06/1993  . Smokeless tobacco: Not on file  . Alcohol use No  . Drug use: Unknown  . Sexual activity: Not on file   Other Topics Concern  . Not on file   Social History Narrative  . No narrative on file    Allergies  Allergen Reactions  . Ciprofloxacin Nausea Only     Review of Systems  Constitutional: Negative for chills, fever, malaise/fatigue and weight loss.  HENT: Negative for ear discharge, ear pain and sore throat.   Eyes: Negative for  blurred vision.  Respiratory: Negative for cough, sputum production, shortness of breath and wheezing.   Cardiovascular: Negative for chest pain, palpitations and leg swelling.  Gastrointestinal: Negative for abdominal pain, blood in stool, constipation, diarrhea, heartburn, melena and nausea.  Genitourinary: Negative for dysuria, frequency, hematuria and urgency.  Musculoskeletal: Negative for back pain, joint pain, myalgias and neck pain.  Skin: Negative for rash.  Neurological: Negative for dizziness, tingling, sensory change, focal weakness and headaches.  Endo/Heme/Allergies: Negative for environmental allergies and polydipsia. Does not bruise/bleed easily.  Psychiatric/Behavioral: Negative for depression and suicidal ideas. The patient is not nervous/anxious and does not have insomnia.      Objective  Vitals:   11/16/15 0807  BP: (!) 138/58  Pulse: 79  Weight: 130 lb (59 kg)  Height: _0  (1.753 m)    Physical Exam  Constitutional: He is oriented to person, place, and time and well-developed, well-nourished, and in no distress.  HENT:  Head: Normocephalic.  Right Ear: External ear normal.  Left Ear: External ear normal.  Nose: Nose normal.  Mouth/Throat: Oropharynx is clear and moist.  Eyes: Conjunctivae and EOM are normal. Pupils are equal, round, and reactive to light. Right eye exhibits no discharge. Left eye exhibits no discharge. No scleral icterus.  Neck: Normal range of motion. Neck supple. No JVD present. No tracheal deviation  present. No thyromegaly present.  Cardiovascular: Normal rate, regular rhythm, normal heart sounds and intact distal pulses.  Exam reveals no gallop and no friction rub.   No murmur heard. Pulmonary/Chest: Breath sounds normal. No respiratory distress. He has no wheezes. He has no rales.  Abdominal: Soft. Bowel sounds are normal. He exhibits no mass. There is no hepatosplenomegaly. There is no tenderness. There is no rebound, no guarding and  no CVA tenderness.  Musculoskeletal: Normal range of motion. He exhibits no edema or tenderness.  Lymphadenopathy:    He has no cervical adenopathy.  Neurological: He is alert and oriented to person, place, and time. He has normal sensation, normal strength and intact cranial nerves. No cranial nerve deficit.  Skin: Skin is warm. No rash noted.  Psychiatric: Mood and affect normal.  Nursing note and vitals reviewed.     Assessment & Plan  Problem List Items Addressed This Visit    None    Visit Diagnoses    Neuropathy ([place])    -  Primary   Relevant Medications   gabapentin (NEURONTIN) 100 MG capsule   Other Relevant Orders   CBC w/Diff/Platelet   Sed Rate (ESR)   Anemia, unspecified type       Relevant Orders   CBC w/Diff/Platelet        Dr. Otilio Miu [facility]  11/16/15

## 2015-11-17 LAB — CBC WITH DIFFERENTIAL/PLATELET
BASOS: 0 %
Basophils Absolute: 0 10*3/uL (ref 0.0–0.2)
EOS (ABSOLUTE): 0.1 10*3/uL (ref 0.0–0.4)
EOS: 2 %
HEMATOCRIT: 30 % — AB (ref 37.5–51.0)
Hemoglobin: 8.9 g/dL — ABNORMAL LOW (ref 12.6–17.7)
Immature Grans (Abs): 0 10*3/uL (ref 0.0–0.1)
Immature Granulocytes: 0 %
LYMPHS ABS: 1.3 10*3/uL (ref 0.7–3.1)
Lymphs: 26 %
MCH: 23.2 pg — AB (ref 26.6–33.0)
MCHC: 29.7 g/dL — AB (ref 31.5–35.7)
MCV: 78 fL — AB (ref 79–97)
MONOS ABS: 0.5 10*3/uL (ref 0.1–0.9)
Monocytes: 10 %
Neutrophils Absolute: 3.3 10*3/uL (ref 1.4–7.0)
Neutrophils: 62 %
Platelets: 276 10*3/uL (ref 150–379)
RBC: 3.83 x10E6/uL — ABNORMAL LOW (ref 4.14–5.80)
RDW: 16 % — ABNORMAL HIGH (ref 12.3–15.4)
WBC: 5.2 10*3/uL (ref 3.4–10.8)

## 2015-11-17 LAB — SEDIMENTATION RATE: Sed Rate: 2 mm/hr (ref 0–30)

## 2015-12-11 DIAGNOSIS — Z23 Encounter for immunization: Secondary | ICD-10-CM | POA: Diagnosis not present

## 2016-06-20 ENCOUNTER — Other Ambulatory Visit: Payer: Self-pay

## 2016-06-20 ENCOUNTER — Telehealth: Payer: Self-pay

## 2016-06-20 NOTE — Telephone Encounter (Signed)
Pt called wanting antibiotics called in for diverticulitis flare up- please sched for appt this afternoon at 1:30 or 2:00- call pt please

## 2016-06-21 ENCOUNTER — Encounter: Payer: Self-pay | Admitting: Family Medicine

## 2016-06-21 ENCOUNTER — Ambulatory Visit (INDEPENDENT_AMBULATORY_CARE_PROVIDER_SITE_OTHER): Payer: Medicare Other | Admitting: Family Medicine

## 2016-06-21 VITALS — BP 138/80 | HR 70 | Ht 69.0 in | Wt 128.0 lb

## 2016-06-21 DIAGNOSIS — K5792 Diverticulitis of intestine, part unspecified, without perforation or abscess without bleeding: Secondary | ICD-10-CM

## 2016-06-21 DIAGNOSIS — K5909 Other constipation: Secondary | ICD-10-CM

## 2016-06-21 DIAGNOSIS — R1084 Generalized abdominal pain: Secondary | ICD-10-CM | POA: Diagnosis not present

## 2016-06-21 DIAGNOSIS — R160 Hepatomegaly, not elsewhere classified: Secondary | ICD-10-CM | POA: Diagnosis not present

## 2016-06-21 MED ORDER — DOCUSATE SODIUM 50 MG PO CAPS: 50.0000 mg | ORAL_CAPSULE | Freq: Two times a day (BID) | ORAL | 0 refills | Status: AC

## 2016-06-21 MED ORDER — SULFAMETHOXAZOLE-TRIMETHOPRIM 800-160 MG PO TABS: 1.0000 | ORAL_TABLET | Freq: Two times a day (BID) | ORAL | 0 refills | Status: DC

## 2016-06-21 MED ORDER — CLINDAMYCIN HCL 300 MG PO CAPS: 300.0000 mg | ORAL_CAPSULE | Freq: Three times a day (TID) | ORAL | 0 refills | Status: DC

## 2016-06-21 NOTE — Progress Notes (Signed)
Name: Donald [name]   MRN: [id number]    DOB: March 15, 1945   Date:06/21/2016       Progress Note  Subjective  Chief Complaint  Chief Complaint  Patient presents with  . Abdominal Pain    has a diverticulitis flare up- had a colonoscopy in 2016 that showed diverticula/diverticulosis throughout the entire colon    Abdominal Pain  This is a chronic problem. The current episode started in the past 7 days. The onset quality is sudden. The problem occurs daily. The problem has been unchanged. The pain is located in the generalized abdominal region. The pain is at a severity of 4/10. The pain is moderate. The quality of the pain is cramping. Pain radiation: always had backpain. Associated symptoms include arthralgias, belching, constipation, a fever and flatus. Pertinent negatives include no diarrhea, dysuria, frequency, headaches, hematochezia, hematuria, melena, myalgias, nausea, vomiting or weight loss. Associated symptoms comments: Low grade fever. Nothing aggravates the pain. The pain is relieved by activity. Prior diagnostic workup includes upper endoscopy, lower endoscopy and ultrasound. There is no history of pancreatitis.    No problem-specific Assessment & Plan notes found for this encounter.   Past Medical History:  Diagnosis Date  . Arthritis    Osteoarthritis  . Full dentures    upper and lower  . GERD (gastroesophageal reflux disease)   . Hyperlipidemia   . Hypertension     Past Surgical History:  Procedure Laterality Date  . BACK SURGERY  [phone]  . COLONOSCOPY N/A 06/25/2014   Procedure: COLONOSCOPY;  Surgeon: Lucilla Lame, MD;  Location: [facility];  Service: Gastroenterology;  Laterality: N/A;  cecum time- 0906  . CORONARY ANGIOPLASTY WITH STENT PLACEMENT  2006  . ESOPHAGOGASTRODUODENOSCOPY N/A 06/25/2014   Procedure: ESOPHAGOGASTRODUODENOSCOPY (EGD);  Surgeon: Lucilla Lame, MD;  Location: [facility];  Service: Gastroenterology;  Laterality: N/A;  .  FRACTURE SURGERY      No family history on file.  Social History   Social History  . Marital status: Married    Spouse name: N/A  . Number of children: N/A  . Years of education: N/A   Occupational History  . Not on file.   Social History Main Topics  . Smoking status: Former Smoker    Quit date: 02/06/1993  . Smokeless tobacco: Never Used  . Alcohol use No  . Drug use: Unknown  . Sexual activity: Not Currently   Other Topics Concern  . Not on file   Social History Narrative  . No narrative on file    Allergies  Allergen Reactions  . Ciprofloxacin Nausea Only  . Flagyl [Metronidazole] Nausea Only    Outpatient Medications Prior to Visit  Medication Sig Dispense Refill  . alfuzosin (UROXATRAL) 10 MG 24 hr tablet Take 10 mg by mouth at bedtime.    Marland Kitchen amLODipine (NORVASC) 10 MG tablet Take 10 mg by mouth daily. AM    . Ascorbic Acid (VITAMIN C) 1000 MG tablet Take 1,000 mg by mouth 3 (three) times daily.     Marland Kitchen aspirin 81 MG tablet Take 81 mg by mouth daily. AM    . atorvastatin (LIPITOR) 80 MG tablet Take 80 mg by mouth daily. AM    . carvedilol (COREG) 3.125 MG tablet Take 3.125 mg by mouth 2 (two) times daily with a meal.    . diclofenac (VOLTAREN) 75 MG EC tablet Take 75 mg by mouth 2 (two) times daily. [facility]    . gabapentin (NEURONTIN) 100 MG capsule Take  1 capsule (100 mg total) by mouth 3 (three) times daily. (Patient taking differently: Take 100 mg by mouth 3 (three) times daily. VA) 90 capsule 1  . HYDROcodone-acetaminophen (NORCO/VICODIN) 5-325 MG per tablet Take 1 tablet by mouth every 6 (six) hours as needed for moderate pain. VA    . lisinopril (PRINIVIL,ZESTRIL) 20 MG tablet Take 20 mg by mouth. AM    . Omega-3 Fatty Acids (FISH OIL) 1200 MG CPDR Take 1 Dose by mouth daily. AM    . omeprazole (PRILOSEC) 20 MG capsule Take 20 mg by mouth daily. AM     No facility-administered medications prior to visit.     Review of Systems  Constitutional: Positive for  fever. Negative for chills, malaise/fatigue and weight loss.  HENT: Negative for ear discharge, ear pain and sore throat.   Eyes: Negative for blurred vision.  Respiratory: Negative for cough, sputum production, shortness of breath and wheezing.   Cardiovascular: Negative for chest pain, palpitations and leg swelling.  Gastrointestinal: Positive for abdominal pain, constipation and flatus. Negative for blood in stool, diarrhea, heartburn, hematochezia, melena, nausea and vomiting.  Genitourinary: Negative for dysuria, frequency, hematuria and urgency.  Musculoskeletal: Positive for arthralgias. Negative for back pain, joint pain, myalgias and neck pain.  Skin: Negative for rash.  Neurological: Negative for dizziness, tingling, sensory change, focal weakness and headaches.  Endo/Heme/Allergies: Negative for environmental allergies and polydipsia. Does not bruise/bleed easily.  Psychiatric/Behavioral: Negative for depression and suicidal ideas. The patient is not nervous/anxious and does not have insomnia.      Objective  Vitals:   06/21/16 0931  BP: 138/80  Pulse: 70  Weight: 128 lb (58.1 kg)  Height: 5\' 9"  (1.753 m)    Physical Exam  Constitutional: He is oriented to person, place, and time and well-developed, well-nourished, and in no distress.  HENT:  Head: Normocephalic.  Right Ear: External ear normal.  Left Ear: External ear normal.  Nose: Nose normal.  Mouth/Throat: Oropharynx is clear and moist.  Eyes: Conjunctivae and EOM are normal. Pupils are equal, round, and reactive to light. Right eye exhibits no discharge. Left eye exhibits no discharge. No scleral icterus.  Neck: Normal range of motion. Neck supple. No JVD present. No tracheal deviation present. No thyromegaly present.  Cardiovascular: Normal rate, regular rhythm, normal heart sounds and intact distal pulses.  Exam reveals no gallop and no friction rub.   No murmur heard. Pulmonary/Chest: Breath sounds normal. No  respiratory distress. He has no wheezes. He has no rales.  Abdominal: Soft. Bowel sounds are normal. He exhibits no mass. There is no hepatosplenomegaly. There is no tenderness. There is no rebound, no guarding and no CVA tenderness.  Musculoskeletal: Normal range of motion. He exhibits no edema or tenderness.  Lymphadenopathy:    He has no cervical adenopathy.  Neurological: He is alert and oriented to person, place, and time. He has normal sensation, normal strength, normal reflexes and intact cranial nerves. No cranial nerve deficit.  Skin: Skin is warm. No rash noted.  Psychiatric: Mood and affect normal.  Nursing note and vitals reviewed.     Assessment & Plan  Problem List Items Addressed This Visit    None    Visit Diagnoses    Generalized abdominal pain    -  Primary   Relevant Medications   sulfamethoxazole-trimethoprim (BACTRIM DS,SEPTRA DS) 800-160 MG tablet   clindamycin (CLEOCIN) 300 MG capsule   Other Relevant Orders   US Abdomen Complete   CBC  Diverticulitis       Relevant Medications   sulfamethoxazole-trimethoprim (BACTRIM DS,SEPTRA DS) 800-160 MG tablet   clindamycin (CLEOCIN) 300 MG capsule   Other Relevant Orders   US Abdomen Complete   CBC   Hepatomegaly       borderline   Relevant Orders   US Abdomen Complete   Other constipation       Relevant Medications   docusate sodium (COLACE) 50 MG capsule      Meds ordered this encounter  Medications  . sulfamethoxazole-trimethoprim (BACTRIM DS,SEPTRA DS) 800-160 MG tablet    Sig: Take 1 tablet by mouth 2 (two) times daily.    Dispense:  20 tablet    Refill:  0  . clindamycin (CLEOCIN) 300 MG capsule    Sig: Take 1 capsule (300 mg total) by mouth 3 (three) times daily.    Dispense:  30 capsule    Refill:  0  . docusate sodium (COLACE) 50 MG capsule    Sig: Take 1 capsule (50 mg total) by mouth 2 (two) times daily.    Dispense:  10 capsule    Refill:  0      Dr. Otilio Miu [facility]  06/21/16

## 2016-06-22 LAB — CBC
Hematocrit: 28.7 % — ABNORMAL LOW (ref 37.5–51.0)
Hemoglobin: 8.9 g/dL — ABNORMAL LOW (ref 13.0–17.7)
MCH: 23.6 pg — ABNORMAL LOW (ref 26.6–33.0)
MCHC: 31 g/dL — ABNORMAL LOW (ref 31.5–35.7)
MCV: 76 fL — ABNORMAL LOW (ref 79–97)
Platelets: 242 10*3/uL (ref 150–379)
RBC: 3.77 x10E6/uL — AB (ref 4.14–5.80)
RDW: 17.9 % — ABNORMAL HIGH (ref 12.3–15.4)
WBC: 5 10*3/uL (ref 3.4–10.8)

## 2016-06-26 ENCOUNTER — Ambulatory Visit (INDEPENDENT_AMBULATORY_CARE_PROVIDER_SITE_OTHER): Payer: Medicare Other | Admitting: Family Medicine

## 2016-06-26 ENCOUNTER — Encounter: Payer: Self-pay | Admitting: Family Medicine

## 2016-06-26 VITALS — BP 116/58 | HR 54 | Ht 69.0 in | Wt 132.0 lb

## 2016-06-26 DIAGNOSIS — K5792 Diverticulitis of intestine, part unspecified, without perforation or abscess without bleeding: Secondary | ICD-10-CM | POA: Diagnosis not present

## 2016-06-26 DIAGNOSIS — K2930 Chronic superficial gastritis without bleeding: Secondary | ICD-10-CM

## 2016-06-26 DIAGNOSIS — D649 Anemia, unspecified: Secondary | ICD-10-CM | POA: Diagnosis not present

## 2016-06-26 LAB — HEMOCCULT GUIAC POC 1CARD (OFFICE): FECAL OCCULT BLD: NEGATIVE

## 2016-06-26 MED ORDER — FERROUS SULFATE 325 (65 FE) MG PO TABS: 325.0000 mg | ORAL_TABLET | Freq: Every day | ORAL | 3 refills | Status: DC

## 2016-06-26 NOTE — Progress Notes (Signed)
Name: Donald [name]   MRN: [id number]    DOB: 1945/07/03   Date:06/26/2016       Progress Note  Subjective  Chief Complaint  Chief Complaint  Patient presents with  . Labs Only    Pt wants to discuss labs with Dr. Ronnald Ramp.     Anemia  Presents for follow-up visit. Symptoms include abdominal pain and weight loss. There has been no anorexia, bruising/bleeding easily, confusion, fever, leg swelling, light-headedness, malaise/fatigue, pallor, palpitations or paresthesias. Signs of blood loss that are not present include hematemesis, hematochezia and melena. There are no compliance problems.  Side effects of medications include GI discomfort.  Abdominal Pain  This is a recurrent problem. The onset quality is gradual. The problem occurs intermittently. The pain is located in the RLQ and LLQ. The pain is mild. The quality of the pain is aching. Associated symptoms include weight loss. Pertinent negatives include no anorexia, constipation, diarrhea, dysuria, fever, frequency, headaches, hematochezia, hematuria, melena, myalgias or nausea.    No problem-specific Assessment & Plan notes found for this encounter.   Past Medical History:  Diagnosis Date  . Arthritis    Osteoarthritis  . Full dentures    upper and lower  . GERD (gastroesophageal reflux disease)   . Hyperlipidemia   . Hypertension     Past Surgical History:  Procedure Laterality Date  . BACK SURGERY  [phone]  . COLONOSCOPY N/A 06/25/2014   Procedure: COLONOSCOPY;  Surgeon: Lucilla Lame, MD;  Location: [place];  Service: Gastroenterology;  Laterality: N/A;  cecum time- 0906  . CORONARY ANGIOPLASTY WITH STENT PLACEMENT  2006  . ESOPHAGOGASTRODUODENOSCOPY N/A 06/25/2014   Procedure: ESOPHAGOGASTRODUODENOSCOPY (EGD);  Surgeon: Lucilla Lame, MD;  Location: [facility] [place];  Service: Gastroenterology;  Laterality: N/A;  . FRACTURE SURGERY      History reviewed. No pertinent family history.  Social History    Social History  . Marital status: Married    Spouse name: N/A  . Number of children: N/A  . Years of education: N/A   Occupational History  . Not on file.   Social History Main Topics  . Smoking status: Former Smoker    Quit date: 02/06/1993  . Smokeless tobacco: Never Used  . Alcohol use No  . Drug use: Unknown  . Sexual activity: Not Currently   Other Topics Concern  . Not on file   Social History Narrative  . No narrative on file    Allergies  Allergen Reactions  . Ciprofloxacin Nausea Only  . Flagyl [Metronidazole] Nausea Only    Outpatient Medications Prior to Visit  Medication Sig Dispense Refill  . alfuzosin (UROXATRAL) 10 MG 24 hr tablet Take 10 mg by mouth at bedtime.    Marland Kitchen amLODipine (NORVASC) 10 MG tablet Take 10 mg by mouth daily. AM    . Ascorbic Acid (VITAMIN C) 1000 MG tablet Take 1,000 mg by mouth 3 (three) times daily.     Marland Kitchen aspirin 81 MG tablet Take 81 mg by mouth daily. AM    . atorvastatin (LIPITOR) 80 MG tablet Take 80 mg by mouth daily. AM    . carvedilol (COREG) 3.125 MG tablet Take 3.125 mg by mouth 2 (two) times daily with a meal.    . clindamycin (CLEOCIN) 300 MG capsule Take 1 capsule (300 mg total) by mouth 3 (three) times daily. 30 capsule 0  . diclofenac (VOLTAREN) 75 MG EC tablet Take 75 mg by mouth 2 (two) times daily. VA    .  docusate sodium (COLACE) 50 MG capsule Take 1 capsule (50 mg total) by mouth 2 (two) times daily. 10 capsule 0  . gabapentin (NEURONTIN) 100 MG capsule Take 1 capsule (100 mg total) by mouth 3 (three) times daily. (Patient taking differently: Take 100 mg by mouth 3 (three) times daily. VA) 90 capsule 1  . HYDROcodone-acetaminophen (NORCO/VICODIN) 5-325 MG per tablet Take 1 tablet by mouth every 6 (six) hours as needed for moderate pain. VA    . lisinopril (PRINIVIL,ZESTRIL) 20 MG tablet Take 20 mg by mouth. AM    . Omega-3 Fatty Acids (FISH OIL) 1200 MG CPDR Take 1 Dose by mouth daily. AM    . omeprazole (PRILOSEC)  20 MG capsule Take 20 mg by mouth daily. AM    . sulfamethoxazole-trimethoprim (BACTRIM DS,SEPTRA DS) 800-160 MG tablet Take 1 tablet by mouth 2 (two) times daily. 20 tablet 0   No facility-administered medications prior to visit.     Review of Systems  Constitutional: Positive for weight loss. Negative for chills, fever and malaise/fatigue.  HENT: Negative for ear discharge, ear pain and sore throat.   Eyes: Negative for blurred vision.  Respiratory: Negative for cough, sputum production, shortness of breath and wheezing.   Cardiovascular: Negative for chest pain, palpitations and leg swelling.  Gastrointestinal: Positive for abdominal pain. Negative for anorexia, blood in stool, constipation, diarrhea, heartburn, hematemesis, hematochezia, melena and nausea.  Genitourinary: Negative for dysuria, frequency, hematuria and urgency.  Musculoskeletal: Negative for back pain, joint pain, myalgias and neck pain.  Skin: Negative for pallor and rash.  Neurological: Negative for dizziness, tingling, sensory change, focal weakness, light-headedness, headaches and paresthesias.  Endo/Heme/Allergies: Negative for environmental allergies and polydipsia. Does not bruise/bleed easily.  Psychiatric/Behavioral: Negative for confusion, depression and suicidal ideas. The patient is not nervous/anxious and does not have insomnia.      Objective  Vitals:   06/26/16 1049  BP: (!) 116/58  Pulse: (!) 54  SpO2: 99%  Weight: 132 lb (59.9 kg)  Height: 5\' 9"  (1.753 m)    Physical Exam  Constitutional: He is oriented to person, place, and time and well-developed, well-nourished, and in no distress.  HENT:  Head: Normocephalic.  Right Ear: External ear normal.  Left Ear: External ear normal.  Nose: Nose normal.  Mouth/Throat: Oropharynx is clear and moist.  Eyes: Conjunctivae and EOM are normal. Pupils are equal, round, and reactive to light. Right eye exhibits no discharge. Left eye exhibits no  discharge. No scleral icterus.  Neck: Normal range of motion. Neck supple. No JVD present. No tracheal deviation present. No thyromegaly present.  Cardiovascular: Normal rate, regular rhythm, normal heart sounds and intact distal pulses.  Exam reveals no gallop and no friction rub.   No murmur heard. Pulmonary/Chest: Breath sounds normal. No respiratory distress. He has no wheezes. He has no rales.  Abdominal: Soft. Bowel sounds are normal. He exhibits no mass. There is no hepatosplenomegaly. There is no tenderness. There is no rebound, no guarding and no CVA tenderness.  Musculoskeletal: Normal range of motion. He exhibits no edema or tenderness.  Lymphadenopathy:    He has no cervical adenopathy.  Neurological: He is alert and oriented to person, place, and time. He has normal sensation, normal strength, normal reflexes and intact cranial nerves. No cranial nerve deficit.  Skin: Skin is warm. No rash noted.  Psychiatric: Mood and affect normal.  Nursing note and vitals reviewed.     Assessment & Plan  Problem List Items Addressed This  Visit    None    Visit Diagnoses    Anemia, unspecified type    -  Primary   Relevant Medications   ferrous sulfate 325 (65 FE) MG tablet   Other Relevant Orders   POCT occult blood stool (Completed)   Ferritin   Diverticulitis       followup on clindamycin/sulfa   Relevant Orders   POCT occult blood stool (Completed)   Ferritin   Chronic superficial gastritis without bleeding       Relevant Orders   POCT occult blood stool (Completed)   Ferritin      Meds ordered this encounter  Medications  . ferrous sulfate 325 (65 FE) MG tablet    Sig: Take 1 tablet (325 mg total) by mouth daily with breakfast.    Dispense:  90 tablet    Refill:  3      Dr. Mac[facility]  06/26/16

## 2016-06-27 ENCOUNTER — Ambulatory Visit: Payer: Medicare Other

## 2016-06-27 LAB — FERRITIN: FERRITIN: 9 ng/mL — AB (ref 30–400)

## 2016-10-30 ENCOUNTER — Other Ambulatory Visit: Payer: Self-pay | Admitting: Orthopedic Surgery

## 2016-10-30 DIAGNOSIS — M25562 Pain in left knee: Secondary | ICD-10-CM

## 2016-10-30 DIAGNOSIS — M170 Bilateral primary osteoarthritis of knee: Secondary | ICD-10-CM

## 2016-10-30 DIAGNOSIS — M25561 Pain in right knee: Secondary | ICD-10-CM

## 2016-11-03 ENCOUNTER — Ambulatory Visit
Admission: RE | Admit: 2016-11-03 | Discharge: 2016-11-03 | Disposition: A | Discharge status: Home / Self Care | Payer: Non-veteran care | Source: Ambulatory Visit | Attending: Orthopedic Surgery | Admitting: Orthopedic Surgery

## 2016-11-03 DIAGNOSIS — M659 Synovitis and tenosynovitis, unspecified: Secondary | ICD-10-CM | POA: Diagnosis not present

## 2016-11-03 DIAGNOSIS — M25762 Osteophyte, left knee: Secondary | ICD-10-CM | POA: Diagnosis not present

## 2016-11-03 DIAGNOSIS — M25561 Pain in right knee: Secondary | ICD-10-CM | POA: Diagnosis present

## 2016-11-03 DIAGNOSIS — I70208 Unspecified atherosclerosis of native arteries of extremities, other extremity: Secondary | ICD-10-CM | POA: Insufficient documentation

## 2016-11-03 DIAGNOSIS — M170 Bilateral primary osteoarthritis of knee: Secondary | ICD-10-CM | POA: Diagnosis not present

## 2016-11-03 DIAGNOSIS — M1712 Unilateral primary osteoarthritis, left knee: Secondary | ICD-10-CM | POA: Insufficient documentation

## 2016-11-03 DIAGNOSIS — M25462 Effusion, left knee: Secondary | ICD-10-CM | POA: Insufficient documentation

## 2016-11-03 DIAGNOSIS — M25562 Pain in left knee: Secondary | ICD-10-CM | POA: Insufficient documentation

## 2016-11-13 DIAGNOSIS — Z23 Encounter for immunization: Secondary | ICD-10-CM | POA: Diagnosis not present

## 2016-12-25 ENCOUNTER — Other Ambulatory Visit: Payer: Self-pay

## 2016-12-25 DIAGNOSIS — M170 Bilateral primary osteoarthritis of knee: Secondary | ICD-10-CM | POA: Diagnosis not present

## 2017-01-10 ENCOUNTER — Encounter
Admission: RE | Admit: 2017-01-10 | Discharge: 2017-01-10 | Disposition: A | Discharge status: Home / Self Care | Payer: Non-veteran care | Source: Ambulatory Visit | Attending: Orthopedic Surgery | Admitting: Orthopedic Surgery

## 2017-01-10 ENCOUNTER — Other Ambulatory Visit: Payer: Self-pay

## 2017-01-10 DIAGNOSIS — Z01810 Encounter for preprocedural cardiovascular examination: Secondary | ICD-10-CM | POA: Diagnosis not present

## 2017-01-10 DIAGNOSIS — E785 Hyperlipidemia, unspecified: Secondary | ICD-10-CM | POA: Diagnosis not present

## 2017-01-10 DIAGNOSIS — Z79899 Other long term (current) drug therapy: Secondary | ICD-10-CM | POA: Diagnosis not present

## 2017-01-10 DIAGNOSIS — M170 Bilateral primary osteoarthritis of knee: Secondary | ICD-10-CM | POA: Insufficient documentation

## 2017-01-10 DIAGNOSIS — K219 Gastro-esophageal reflux disease without esophagitis: Secondary | ICD-10-CM | POA: Insufficient documentation

## 2017-01-10 DIAGNOSIS — I10 Essential (primary) hypertension: Secondary | ICD-10-CM | POA: Insufficient documentation

## 2017-01-10 DIAGNOSIS — Z7982 Long term (current) use of aspirin: Secondary | ICD-10-CM | POA: Diagnosis not present

## 2017-01-10 DIAGNOSIS — I440 Atrioventricular block, first degree: Secondary | ICD-10-CM | POA: Insufficient documentation

## 2017-01-10 DIAGNOSIS — Z01818 Encounter for other preprocedural examination: Secondary | ICD-10-CM | POA: Diagnosis not present

## 2017-01-10 HISTORY — DX: Anemia, unspecified: D64.9

## 2017-01-10 HISTORY — DX: Atherosclerotic heart disease of native coronary artery without angina pectoris: I25.10

## 2017-01-10 LAB — BASIC METABOLIC PANEL
Anion gap: 8 (ref 5–15)
BUN: 11 mg/dL (ref 6–20)
CALCIUM: 9.2 mg/dL (ref 8.9–10.3)
CO2: 24 mmol/L (ref 22–32)
CREATININE: 0.68 mg/dL (ref 0.61–1.24)
Chloride: 107 mmol/L (ref 101–111)
GFR calc Af Amer: >60 mL/min (ref >60)
GLUCOSE: 105 mg/dL — AB (ref 65–99)
Potassium: 4.1 mmol/L (ref 3.5–5.1)
SODIUM: 139 mmol/L (ref 135–145)

## 2017-01-10 LAB — CBC
HCT: 37.5 % — ABNORMAL LOW (ref 40.0–52.0)
Hemoglobin: 12.6 g/dL — ABNORMAL LOW (ref 13.0–18.0)
MCH: 31.4 pg (ref 26.0–34.0)
MCHC: 33.7 g/dL (ref 32.0–36.0)
MCV: 93 fL (ref 80.0–100.0)
PLATELETS: 214 10*3/uL (ref 150–440)
RBC: 4.03 MIL/uL — ABNORMAL LOW (ref 4.40–5.90)
RDW: 12.9 % (ref 11.5–14.5)
WBC: 7.3 10*3/uL (ref 3.8–10.6)

## 2017-01-10 LAB — URINALYSIS, ROUTINE W REFLEX MICROSCOPIC
Bilirubin Urine: NEGATIVE
GLUCOSE, UA: NEGATIVE mg/dL
Hgb urine dipstick: NEGATIVE
Ketones, ur: NEGATIVE mg/dL
LEUKOCYTES UA: NEGATIVE
Nitrite: NEGATIVE
PROTEIN: NEGATIVE mg/dL
Specific Gravity, Urine: 1.005 (ref 1.005–1.030)
pH: 6 (ref 5.0–8.0)

## 2017-01-10 LAB — SURGICAL PCR SCREEN
MRSA, PCR: NEGATIVE
Staphylococcus aureus: NEGATIVE

## 2017-01-10 LAB — PROTIME-INR
INR: 0.98
Prothrombin Time: 12.9 seconds (ref 11.4–15.2)

## 2017-01-10 LAB — APTT: aPTT: 35 seconds (ref 24–36)

## 2017-01-10 LAB — SEDIMENTATION RATE: Sed Rate: 30 mm/hr — ABNORMAL HIGH (ref 0–20)

## 2017-01-10 NOTE — Patient Instructions (Addendum)
Your procedure is scheduled on: Tuesday, January 23, 2017 Report to Same Day Surgery on the 2nd floor in the [facility]. To find out your arrival time, please call [phone] between 1PM - 3PM on: Monday, January 22, 2017  REMEMBER: Instructions that are not followed completely may result in serious medical risk, up to and including death; or upon the discretion of your surgeon and anesthesiologist your surgery may need to be rescheduled.  Do not eat food after midnight the night before your procedure.  No gum chewing or hard candies.  You may however, drink CLEAR liquids up to 2 hours before you are scheduled to arrive at the hospital for your procedure.  Do not drink clear liquids within 2 hours of the start of your surgery.  Clear liquids include: - water  - apple juice without pulp - clear gatorade - black coffee or tea (Do NOT add anything to the coffee or tea) Do NOT drink anything that is not on this list.  No Alcohol for 24 hours before or after surgery.  No Smoking including e-cigarettes for 24 hours prior to surgery. No chewable tobacco products for at least 6 hours prior to surgery. No nicotine patches on the day of surgery.  Notify your doctor if there is any change in your medical condition (cold, fever, infection).  Do not wear jewelry, make-up, hairpins, clips or nail polish.  Do not wear lotions, powders, or perfumes. You may wear deodorant.  Do not shave 48 hours prior to surgery. Men may shave face and neck.  Contacts and dentures may not be worn into surgery.  Do not bring valuables to the hospital. [facility] is not responsible for any belongings or valuables.   TAKE THESE MEDICATIONS THE MORNING OF SURGERY WITH A SIP OF WATER:  1.  AMLODIPINE 2.  CARVEDILOL 3.  PANTOPRAZOLE 4.  HYDROCODONE (if needed for pain)  Use CHG Soap as directed on instruction sheet.  Follow recommendations from Cardiologist or PCP regarding stopping Aspirin. STOP  December 10.  On December 10 - Stop Anti-inflammatories such as DICLOFENAC, Advil, Aleve, Ibuprofen, Motrin, Naproxen, Naprosyn, Goodie powder, or aspirin products. (May take Tylenol or Acetaminophen if needed.)  On December 10 - Stop ANY OVER THE COUNTER supplements ([place]) until after surgery.   If you are being admitted to the hospital overnight, leave your suitcase in the car. After surgery it may be brought to your room.  Please call the number above if you have any questions about these instructions.

## 2017-01-11 LAB — URINE CULTURE: CULTURE: NO GROWTH

## 2017-01-22 MED ORDER — CEFAZOLIN SODIUM-DEXTROSE 2-4 GM/100ML-% IV SOLN
2.0000 g | Freq: Once | INTRAVENOUS | Status: AC
  Administered 2017-01-23: 2 g via INTRAVENOUS

## 2017-01-22 MED ORDER — TRANEXAMIC ACID 1000 MG/10ML IV SOLN
1000.0000 mg | INTRAVENOUS | Status: AC
  Administered 2017-01-23: 1000 mg via INTRAVENOUS
  Filled 2017-01-22: qty 10

## 2017-01-23 ENCOUNTER — Inpatient Hospital Stay: Payer: Non-veteran care

## 2017-01-23 ENCOUNTER — Inpatient Hospital Stay
Admission: RE | Admit: 2017-01-23 | Discharge: 2017-01-25 | DRG: 469 | Disposition: A | Discharge status: Home Health | Payer: Non-veteran care | Source: Ambulatory Visit | Attending: Orthopedic Surgery | Admitting: Orthopedic Surgery

## 2017-01-23 ENCOUNTER — Inpatient Hospital Stay: Payer: Non-veteran care | Admitting: Anesthesiology

## 2017-01-23 ENCOUNTER — Encounter
Admission: RE | Disposition: A | Discharge status: Home Health | Payer: Self-pay | Source: Ambulatory Visit | Attending: Orthopedic Surgery

## 2017-01-23 ENCOUNTER — Other Ambulatory Visit: Payer: Self-pay

## 2017-01-23 ENCOUNTER — Encounter: Payer: Self-pay | Admitting: Emergency Medicine

## 2017-01-23 DIAGNOSIS — N400 Enlarged prostate without lower urinary tract symptoms: Secondary | ICD-10-CM | POA: Diagnosis present

## 2017-01-23 DIAGNOSIS — Z87891 Personal history of nicotine dependence: Secondary | ICD-10-CM

## 2017-01-23 DIAGNOSIS — M24561 Contracture, right knee: Secondary | ICD-10-CM | POA: Diagnosis present

## 2017-01-23 DIAGNOSIS — E43 Unspecified severe protein-calorie malnutrition: Secondary | ICD-10-CM

## 2017-01-23 DIAGNOSIS — K529 Noninfective gastroenteritis and colitis, unspecified: Secondary | ICD-10-CM | POA: Diagnosis present

## 2017-01-23 DIAGNOSIS — M24562 Contracture, left knee: Secondary | ICD-10-CM | POA: Diagnosis present

## 2017-01-23 DIAGNOSIS — Z79899 Other long term (current) drug therapy: Secondary | ICD-10-CM

## 2017-01-23 DIAGNOSIS — Z681 Body mass index (BMI) 19 or less, adult: Secondary | ICD-10-CM | POA: Diagnosis not present

## 2017-01-23 DIAGNOSIS — M1712 Unilateral primary osteoarthritis, left knee: Secondary | ICD-10-CM | POA: Diagnosis present

## 2017-01-23 DIAGNOSIS — M21162 Varus deformity, not elsewhere classified, left knee: Secondary | ICD-10-CM | POA: Diagnosis present

## 2017-01-23 DIAGNOSIS — Z9049 Acquired absence of other specified parts of digestive tract: Secondary | ICD-10-CM

## 2017-01-23 DIAGNOSIS — K219 Gastro-esophageal reflux disease without esophagitis: Secondary | ICD-10-CM | POA: Diagnosis present

## 2017-01-23 DIAGNOSIS — D638 Anemia in other chronic diseases classified elsewhere: Secondary | ICD-10-CM | POA: Diagnosis present

## 2017-01-23 DIAGNOSIS — M21161 Varus deformity, not elsewhere classified, right knee: Secondary | ICD-10-CM | POA: Diagnosis present

## 2017-01-23 DIAGNOSIS — Z7982 Long term (current) use of aspirin: Secondary | ICD-10-CM

## 2017-01-23 DIAGNOSIS — E785 Hyperlipidemia, unspecified: Secondary | ICD-10-CM | POA: Diagnosis present

## 2017-01-23 DIAGNOSIS — Z86010 Personal history of colonic polyps: Secondary | ICD-10-CM | POA: Diagnosis not present

## 2017-01-23 DIAGNOSIS — Z955 Presence of coronary angioplasty implant and graft: Secondary | ICD-10-CM | POA: Diagnosis not present

## 2017-01-23 DIAGNOSIS — I10 Essential (primary) hypertension: Secondary | ICD-10-CM | POA: Diagnosis present

## 2017-01-23 DIAGNOSIS — I2510 Atherosclerotic heart disease of native coronary artery without angina pectoris: Secondary | ICD-10-CM | POA: Diagnosis present

## 2017-01-23 DIAGNOSIS — G8918 Other acute postprocedural pain: Secondary | ICD-10-CM

## 2017-01-23 DIAGNOSIS — K259 Gastric ulcer, unspecified as acute or chronic, without hemorrhage or perforation: Secondary | ICD-10-CM | POA: Diagnosis present

## 2017-01-23 DIAGNOSIS — D62 Acute posthemorrhagic anemia: Secondary | ICD-10-CM | POA: Diagnosis not present

## 2017-01-23 DIAGNOSIS — Z888 Allergy status to other drugs, medicaments and biological substances status: Secondary | ICD-10-CM

## 2017-01-23 DIAGNOSIS — Z972 Presence of dental prosthetic device (complete) (partial): Secondary | ICD-10-CM | POA: Diagnosis not present

## 2017-01-23 DIAGNOSIS — M064 Inflammatory polyarthropathy: Secondary | ICD-10-CM | POA: Diagnosis present

## 2017-01-23 DIAGNOSIS — Z881 Allergy status to other antibiotic agents status: Secondary | ICD-10-CM

## 2017-01-23 DIAGNOSIS — M170 Bilateral primary osteoarthritis of knee: Principal | ICD-10-CM | POA: Diagnosis present

## 2017-01-23 HISTORY — PX: TOTAL KNEE ARTHROPLASTY: SHX125

## 2017-01-23 LAB — ABO/RH: ABO/RH(D): B NEG

## 2017-01-23 LAB — CBC
HEMATOCRIT: 35.9 % — AB (ref 40.0–52.0)
HEMOGLOBIN: 12 g/dL — AB (ref 13.0–18.0)
MCH: 31.4 pg (ref 26.0–34.0)
MCHC: 33.5 g/dL (ref 32.0–36.0)
MCV: 93.8 fL (ref 80.0–100.0)
Platelets: 181 10*3/uL (ref 150–440)
RBC: 3.83 MIL/uL — AB (ref 4.40–5.90)
RDW: 13.4 % (ref 11.5–14.5)
WBC: 9.2 10*3/uL (ref 3.8–10.6)

## 2017-01-23 LAB — CREATININE, SERUM: Creatinine, Ser: 0.74 mg/dL (ref 0.61–1.24)

## 2017-01-23 SURGERY — ARTHROPLASTY, KNEE, TOTAL
Anesthesia: General | Laterality: Left

## 2017-01-23 MED ORDER — HYDROMORPHONE HCL 1 MG/ML IJ SOLN
INTRAMUSCULAR | Status: AC
  Filled 2017-01-23: qty 1

## 2017-01-23 MED ORDER — METOCLOPRAMIDE HCL 5 MG/ML IJ SOLN: 5.0000 mg | Freq: Three times a day (TID) | INTRAMUSCULAR | Status: DC | PRN

## 2017-01-23 MED ORDER — ONDANSETRON HCL 4 MG/2ML IJ SOLN
INTRAMUSCULAR | Status: DC | PRN
  Administered 2017-01-23: 4 mg via INTRAVENOUS

## 2017-01-23 MED ORDER — MORPHINE SULFATE (PF) 2 MG/ML IV SOLN
2.0000 mg | INTRAVENOUS | Status: DC | PRN
  Administered 2017-01-23: 2 mg via INTRAVENOUS
  Filled 2017-01-23: qty 1

## 2017-01-23 MED ORDER — MAGNESIUM HYDROXIDE 400 MG/5ML PO SUSP
30.0000 mL | Freq: Every day | ORAL | Status: DC | PRN
  Administered 2017-01-24: 30 mL via ORAL
  Filled 2017-01-23: qty 30

## 2017-01-23 MED ORDER — ZOLPIDEM TARTRATE 5 MG PO TABS: 5.0000 mg | ORAL_TABLET | Freq: Every evening | ORAL | Status: DC | PRN

## 2017-01-23 MED ORDER — PHENYLEPHRINE HCL 10 MG/ML IJ SOLN
INTRAMUSCULAR | Status: AC
  Filled 2017-01-23: qty 1

## 2017-01-23 MED ORDER — LIDOCAINE HCL (PF) 2 % IJ SOLN
INTRAMUSCULAR | Status: AC
  Filled 2017-01-23: qty 10

## 2017-01-23 MED ORDER — HYDROMORPHONE HCL 1 MG/ML IJ SOLN
0.5000 mg | INTRAMUSCULAR | Status: DC | PRN
  Administered 2017-01-23 (×2): 0.5 mg via INTRAVENOUS

## 2017-01-23 MED ORDER — MORPHINE SULFATE 10 MG/ML IJ SOLN
INTRAMUSCULAR | Status: DC | PRN
  Administered 2017-01-23: 10 mg via INTRAVENOUS

## 2017-01-23 MED ORDER — BUPIVACAINE LIPOSOME 1.3 % IJ SUSP
INTRAMUSCULAR | Status: AC
  Filled 2017-01-23: qty 20

## 2017-01-23 MED ORDER — ACETAMINOPHEN 650 MG RE SUPP: 650.0000 mg | RECTAL | Status: DC | PRN

## 2017-01-23 MED ORDER — CEFAZOLIN SODIUM-DEXTROSE 1-4 GM/50ML-% IV SOLN
1.0000 g | Freq: Four times a day (QID) | INTRAVENOUS | Status: DC
  Filled 2017-01-23 (×3): qty 50

## 2017-01-23 MED ORDER — METHOCARBAMOL 500 MG PO TABS
500.0000 mg | ORAL_TABLET | Freq: Four times a day (QID) | ORAL | Status: DC | PRN
  Administered 2017-01-24: 500 mg via ORAL
  Filled 2017-01-23: qty 1

## 2017-01-23 MED ORDER — MENTHOL 3 MG MT LOZG
1.0000 | LOZENGE | OROMUCOSAL | Status: DC | PRN
  Filled 2017-01-23: qty 9

## 2017-01-23 MED ORDER — KETAMINE HCL 50 MG/ML IJ SOLN
INTRAMUSCULAR | Status: DC | PRN
Start: 2017-01-23 — End: 2017-01-23
  Administered 2017-01-23: 25 mg via INTRAVENOUS
  Administered 2017-01-23: 25 mg via INTRAMUSCULAR

## 2017-01-23 MED ORDER — ROCURONIUM BROMIDE 50 MG/5ML IV SOLN
INTRAVENOUS | Status: AC
  Filled 2017-01-23: qty 1

## 2017-01-23 MED ORDER — VITAMIN C 500 MG PO TABS
500.0000 mg | ORAL_TABLET | Freq: Every day | ORAL | Status: DC
  Administered 2017-01-23 – 2017-01-25 (×3): 500 mg via ORAL
  Filled 2017-01-23 (×3): qty 1

## 2017-01-23 MED ORDER — SODIUM CHLORIDE 0.9 % IJ SOLN
INTRAMUSCULAR | Status: AC
  Filled 2017-01-23: qty 100

## 2017-01-23 MED ORDER — PROPOFOL 500 MG/50ML IV EMUL
INTRAVENOUS | Status: AC
  Filled 2017-01-23: qty 50

## 2017-01-23 MED ORDER — MAGNESIUM CITRATE PO SOLN
1.0000 | Freq: Once | ORAL | Status: AC | PRN
  Administered 2017-01-25: 1 via ORAL
  Filled 2017-01-23 (×3): qty 296

## 2017-01-23 MED ORDER — EPHEDRINE SULFATE 50 MG/ML IJ SOLN
INTRAMUSCULAR | Status: AC
  Filled 2017-01-23: qty 1

## 2017-01-23 MED ORDER — GLYCOPYRROLATE 0.2 MG/ML IJ SOLN
INTRAMUSCULAR | Status: AC
  Filled 2017-01-23: qty 1

## 2017-01-23 MED ORDER — CARVEDILOL 3.125 MG PO TABS
3.1250 mg | ORAL_TABLET | Freq: Two times a day (BID) | ORAL | Status: DC
  Administered 2017-01-23 – 2017-01-25 (×4): 3.125 mg via ORAL
  Filled 2017-01-23 (×4): qty 1

## 2017-01-23 MED ORDER — PROPOFOL 10 MG/ML IV BOLUS
INTRAVENOUS | Status: AC
  Filled 2017-01-23: qty 20

## 2017-01-23 MED ORDER — DEXAMETHASONE SODIUM PHOSPHATE 10 MG/ML IJ SOLN
INTRAMUSCULAR | Status: DC | PRN
  Administered 2017-01-23: 5 mg via INTRAVENOUS

## 2017-01-23 MED ORDER — OXYCODONE HCL 5 MG PO TABS
10.0000 mg | ORAL_TABLET | ORAL | Status: DC | PRN
  Administered 2017-01-23 – 2017-01-25 (×10): 10 mg via ORAL
  Filled 2017-01-23 (×11): qty 2

## 2017-01-23 MED ORDER — OXYCODONE HCL 5 MG PO TABS: 5.0000 mg | ORAL_TABLET | ORAL | Status: DC | PRN

## 2017-01-23 MED ORDER — KETOROLAC TROMETHAMINE 30 MG/ML IJ SOLN
INTRAMUSCULAR | Status: AC
  Filled 2017-01-23: qty 1

## 2017-01-23 MED ORDER — PROPOFOL 10 MG/ML IV BOLUS
INTRAVENOUS | Status: DC | PRN
  Administered 2017-01-23: 140 mg via INTRAVENOUS

## 2017-01-23 MED ORDER — ALFUZOSIN HCL ER 10 MG PO TB24
10.0000 mg | ORAL_TABLET | Freq: Every day | ORAL | Status: DC
  Administered 2017-01-23 – 2017-01-24 (×2): 10 mg via ORAL
  Filled 2017-01-23 (×3): qty 1

## 2017-01-23 MED ORDER — ATORVASTATIN CALCIUM 20 MG PO TABS
80.0000 mg | ORAL_TABLET | Freq: Every day | ORAL | Status: DC
  Administered 2017-01-23 – 2017-01-24 (×2): 80 mg via ORAL
  Filled 2017-01-23 (×2): qty 4

## 2017-01-23 MED ORDER — ONDANSETRON HCL 4 MG PO TABS: 4.0000 mg | ORAL_TABLET | Freq: Four times a day (QID) | ORAL | Status: DC | PRN

## 2017-01-23 MED ORDER — EPINEPHRINE PF 1 MG/ML IJ SOLN
INTRAMUSCULAR | Status: AC
  Filled 2017-01-23: qty 1

## 2017-01-23 MED ORDER — FENTANYL CITRATE (PF) 100 MCG/2ML IJ SOLN
INTRAMUSCULAR | Status: AC
  Administered 2017-01-23: 25 ug
  Filled 2017-01-23: qty 2

## 2017-01-23 MED ORDER — EPHEDRINE SULFATE 50 MG/ML IJ SOLN
INTRAMUSCULAR | Status: DC | PRN
  Administered 2017-01-23: 10 mg via INTRAVENOUS

## 2017-01-23 MED ORDER — NEOMYCIN-POLYMYXIN B GU 40-200000 IR SOLN
Status: DC | PRN
  Administered 2017-01-23: 12 mL

## 2017-01-23 MED ORDER — FERROUS SULFATE 325 (65 FE) MG PO TABS
325.0000 mg | ORAL_TABLET | Freq: Every day | ORAL | Status: DC
  Administered 2017-01-24 – 2017-01-25 (×2): 325 mg via ORAL
  Filled 2017-01-23 (×2): qty 1

## 2017-01-23 MED ORDER — ONDANSETRON HCL 4 MG/2ML IJ SOLN
INTRAMUSCULAR | Status: AC
  Filled 2017-01-23: qty 2

## 2017-01-23 MED ORDER — PHENOL 1.4 % MT LIQD
1.0000 | OROMUCOSAL | Status: DC | PRN
Start: 2017-01-23 — End: 2017-01-25
  Filled 2017-01-23: qty 177

## 2017-01-23 MED ORDER — METOCLOPRAMIDE HCL 10 MG PO TABS: 5.0000 mg | ORAL_TABLET | Freq: Three times a day (TID) | ORAL | Status: DC | PRN

## 2017-01-23 MED ORDER — DICLOFENAC SODIUM 75 MG PO TBEC
75.0000 mg | DELAYED_RELEASE_TABLET | Freq: Two times a day (BID) | ORAL | Status: DC
  Administered 2017-01-23 – 2017-01-25 (×4): 75 mg via ORAL
  Filled 2017-01-23 (×6): qty 1

## 2017-01-23 MED ORDER — PANTOPRAZOLE SODIUM 40 MG PO TBEC
40.0000 mg | DELAYED_RELEASE_TABLET | Freq: Two times a day (BID) | ORAL | Status: DC
  Administered 2017-01-23 – 2017-01-25 (×4): 40 mg via ORAL
  Filled 2017-01-23 (×4): qty 1

## 2017-01-23 MED ORDER — MORPHINE SULFATE (PF) 10 MG/ML IV SOLN
INTRAVENOUS | Status: AC
  Filled 2017-01-23: qty 1

## 2017-01-23 MED ORDER — HYDROMORPHONE HCL 1 MG/ML IJ SOLN
INTRAMUSCULAR | Status: DC | PRN
  Administered 2017-01-23 (×2): 0.5 mg via INTRAVENOUS

## 2017-01-23 MED ORDER — OMEGA-3-ACID ETHYL ESTERS 1 G PO CAPS
1.0000 g | ORAL_CAPSULE | Freq: Every day | ORAL | Status: DC
  Administered 2017-01-24 – 2017-01-25 (×2): 1 g via ORAL
  Filled 2017-01-23 (×2): qty 1

## 2017-01-23 MED ORDER — SODIUM CHLORIDE 0.9 % IV SOLN
INTRAVENOUS | Status: DC
  Administered 2017-01-23 – 2017-01-24 (×2): via INTRAVENOUS

## 2017-01-23 MED ORDER — PROPOFOL 500 MG/50ML IV EMUL: INTRAVENOUS | Status: DC | PRN

## 2017-01-23 MED ORDER — BUPIVACAINE HCL (PF) 0.5 % IJ SOLN
INTRAMUSCULAR | Status: AC
  Filled 2017-01-23: qty 10

## 2017-01-23 MED ORDER — SUCCINYLCHOLINE CHLORIDE 20 MG/ML IJ SOLN
INTRAMUSCULAR | Status: AC
  Filled 2017-01-23: qty 1

## 2017-01-23 MED ORDER — BUPIVACAINE-EPINEPHRINE (PF) 0.25% -1:200000 IJ SOLN
INTRAMUSCULAR | Status: AC
  Filled 2017-01-23: qty 30

## 2017-01-23 MED ORDER — ASPIRIN EC 81 MG PO TBEC
81.0000 mg | DELAYED_RELEASE_TABLET | Freq: Every day | ORAL | Status: DC
  Administered 2017-01-24 – 2017-01-25 (×2): 81 mg via ORAL
  Filled 2017-01-23 (×2): qty 1

## 2017-01-23 MED ORDER — FENTANYL CITRATE (PF) 100 MCG/2ML IJ SOLN
INTRAMUSCULAR | Status: AC
  Filled 2017-01-23: qty 2

## 2017-01-23 MED ORDER — ASPIRIN 81 MG PO TABS
81.0000 mg | ORAL_TABLET | Freq: Every day | ORAL | Status: DC
  Filled 2017-01-23: qty 1

## 2017-01-23 MED ORDER — LIDOCAINE HCL (PF) 2 % IJ SOLN
INTRAMUSCULAR | Status: DC | PRN
  Administered 2017-01-23: 50 mg

## 2017-01-23 MED ORDER — ONDANSETRON HCL 4 MG/2ML IJ SOLN: 4.0000 mg | Freq: Four times a day (QID) | INTRAMUSCULAR | Status: DC | PRN

## 2017-01-23 MED ORDER — MIDAZOLAM HCL 2 MG/2ML IJ SOLN
INTRAMUSCULAR | Status: AC
  Filled 2017-01-23: qty 2

## 2017-01-23 MED ORDER — ENOXAPARIN SODIUM 30 MG/0.3ML ~~LOC~~ SOLN
30.0000 mg | Freq: Two times a day (BID) | SUBCUTANEOUS | Status: DC
  Administered 2017-01-24 – 2017-01-25 (×3): 30 mg via SUBCUTANEOUS
  Filled 2017-01-23 (×3): qty 0.3

## 2017-01-23 MED ORDER — KETOROLAC TROMETHAMINE 30 MG/ML IJ SOLN
INTRAMUSCULAR | Status: DC | PRN
  Administered 2017-01-23: 30 mg via INTRAVENOUS

## 2017-01-23 MED ORDER — ACETAMINOPHEN 10 MG/ML IV SOLN
INTRAVENOUS | Status: AC
  Filled 2017-01-23: qty 100

## 2017-01-23 MED ORDER — LISINOPRIL 10 MG PO TABS
10.0000 mg | ORAL_TABLET | Freq: Every day | ORAL | Status: DC
  Administered 2017-01-24 – 2017-01-25 (×2): 10 mg via ORAL
  Filled 2017-01-23 (×2): qty 1

## 2017-01-23 MED ORDER — ROCURONIUM BROMIDE 100 MG/10ML IV SOLN
INTRAVENOUS | Status: DC | PRN
  Administered 2017-01-23: 30 mg via INTRAVENOUS

## 2017-01-23 MED ORDER — GLYCOPYRROLATE 0.2 MG/ML IJ SOLN
INTRAMUSCULAR | Status: DC | PRN
  Administered 2017-01-23: 0.2 mg via INTRAVENOUS

## 2017-01-23 MED ORDER — LACTATED RINGERS IV SOLN
INTRAVENOUS | Status: DC
  Administered 2017-01-23 (×2): via INTRAVENOUS

## 2017-01-23 MED ORDER — DEXAMETHASONE SODIUM PHOSPHATE 10 MG/ML IJ SOLN
INTRAMUSCULAR | Status: AC
  Filled 2017-01-23: qty 1

## 2017-01-23 MED ORDER — KETAMINE HCL 50 MG/ML IJ SOLN
INTRAMUSCULAR | Status: AC
  Filled 2017-01-23: qty 10

## 2017-01-23 MED ORDER — SODIUM CHLORIDE 0.9 % IV SOLN
INTRAVENOUS | Status: DC | PRN
  Administered 2017-01-23: 60 mL

## 2017-01-23 MED ORDER — DOCUSATE SODIUM 100 MG PO CAPS
100.0000 mg | ORAL_CAPSULE | Freq: Two times a day (BID) | ORAL | Status: DC
  Administered 2017-01-23 – 2017-01-25 (×4): 100 mg via ORAL
  Filled 2017-01-23 (×4): qty 1

## 2017-01-23 MED ORDER — DIPHENHYDRAMINE HCL 12.5 MG/5ML PO ELIX: 12.5000 mg | ORAL_SOLUTION | ORAL | Status: DC | PRN

## 2017-01-23 MED ORDER — CLONAZEPAM 0.5 MG PO TABS
1.0000 mg | ORAL_TABLET | Freq: Every day | ORAL | Status: DC
  Administered 2017-01-23 – 2017-01-24 (×2): 1 mg via ORAL
  Filled 2017-01-23 (×2): qty 2

## 2017-01-23 MED ORDER — SUCCINYLCHOLINE CHLORIDE 20 MG/ML IJ SOLN
INTRAMUSCULAR | Status: DC | PRN
  Administered 2017-01-23: 80 mg via INTRAVENOUS

## 2017-01-23 MED ORDER — BUPIVACAINE-EPINEPHRINE (PF) 0.25% -1:200000 IJ SOLN
INTRAMUSCULAR | Status: DC | PRN
  Administered 2017-01-23: 30 mL via PERINEURAL

## 2017-01-23 MED ORDER — FENTANYL CITRATE (PF) 100 MCG/2ML IJ SOLN
25.0000 ug | INTRAMUSCULAR | Status: DC | PRN
  Administered 2017-01-23 (×4): 25 ug via INTRAVENOUS

## 2017-01-23 MED ORDER — FISH OIL 1200 MG PO CPDR: 1200.0000 mg | DELAYED_RELEASE_CAPSULE | Freq: Every day | ORAL | Status: DC

## 2017-01-23 MED ORDER — ACETAMINOPHEN 10 MG/ML IV SOLN
INTRAVENOUS | Status: DC | PRN
  Administered 2017-01-23: 1000 mg via INTRAVENOUS

## 2017-01-23 MED ORDER — DEXTROSE 5 % IV SOLN
1.0000 g | Freq: Four times a day (QID) | INTRAVENOUS | Status: AC
  Administered 2017-01-23 – 2017-01-24 (×3): 1 g via INTRAVENOUS
  Filled 2017-01-23 (×3): qty 10

## 2017-01-23 MED ORDER — MIDAZOLAM HCL 5 MG/5ML IJ SOLN
INTRAMUSCULAR | Status: DC | PRN
  Administered 2017-01-23 (×2): 1 mg via INTRAVENOUS

## 2017-01-23 MED ORDER — ONDANSETRON HCL 4 MG/2ML IJ SOLN: 4.0000 mg | Freq: Once | INTRAMUSCULAR | Status: DC | PRN

## 2017-01-23 MED ORDER — CEFAZOLIN SODIUM-DEXTROSE 2-4 GM/100ML-% IV SOLN
INTRAVENOUS | Status: AC
  Filled 2017-01-23: qty 100

## 2017-01-23 MED ORDER — AMLODIPINE BESYLATE 5 MG PO TABS
5.0000 mg | ORAL_TABLET | Freq: Every day | ORAL | Status: DC
  Administered 2017-01-24 – 2017-01-25 (×2): 5 mg via ORAL
  Filled 2017-01-23 (×2): qty 1

## 2017-01-23 MED ORDER — FENTANYL CITRATE (PF) 100 MCG/2ML IJ SOLN
INTRAMUSCULAR | Status: DC | PRN
  Administered 2017-01-23 (×2): 50 ug via INTRAVENOUS

## 2017-01-23 MED ORDER — METHOCARBAMOL 1000 MG/10ML IJ SOLN
500.0000 mg | Freq: Four times a day (QID) | INTRAVENOUS | Status: DC | PRN
  Filled 2017-01-23: qty 5

## 2017-01-23 MED ORDER — NEOMYCIN-POLYMYXIN B GU 40-200000 IR SOLN
Status: AC
  Filled 2017-01-23: qty 20

## 2017-01-23 MED ORDER — BISACODYL 10 MG RE SUPP: 10.0000 mg | Freq: Every day | RECTAL | Status: DC | PRN

## 2017-01-23 MED ORDER — PHENYLEPHRINE HCL 10 MG/ML IJ SOLN
INTRAMUSCULAR | Status: DC | PRN
  Administered 2017-01-23: 100 ug via INTRAVENOUS

## 2017-01-23 MED ORDER — ACETAMINOPHEN 325 MG PO TABS: 650.0000 mg | ORAL_TABLET | ORAL | Status: DC | PRN

## 2017-01-23 SURGICAL SUPPLY — 60 items
BANDAGE ACE 6X5 VEL STRL LF (GAUZE/BANDAGES/DRESSINGS) ×3 IMPLANT
BLADE SAW 1 (BLADE) ×3 IMPLANT
BLOCK CUTTING FEMUR 3 LT MED (MISCELLANEOUS) IMPLANT
BLOCK CUTTING TIBIAL 3 LT (MISCELLANEOUS) IMPLANT
CANISTER SUCT 1200ML W/VALVE (MISCELLANEOUS) ×3 IMPLANT
CANISTER SUCT 3000ML PPV (MISCELLANEOUS) ×6 IMPLANT
CAPT KNEE TOTAL 3 ×3 IMPLANT
CEMENT HV SMART SET (Cement) ×6 IMPLANT
CHLORAPREP W/TINT 26ML (MISCELLANEOUS) ×6 IMPLANT
COOLER POLAR GLACIER W/PUMP (MISCELLANEOUS) ×3 IMPLANT
CUFF TOURN SGL QUICK 18 (TOURNIQUET CUFF) ×3 IMPLANT
DRAPE SHEET LG 3/4 BI-LAMINATE (DRAPES) ×6 IMPLANT
ELECT CAUTERY BLADE 6.4 (BLADE) ×3 IMPLANT
ELECT REM PT RETURN 9FT ADLT (ELECTROSURGICAL) ×3
ELECTRODE REM PT RTRN 9FT ADLT (ELECTROSURGICAL) ×1 IMPLANT
FEMUR BONE MODEL 4.9010 MEDACT (Bone Implant) ×3 IMPLANT
GAUZE PETRO XEROFOAM 1X8 (MISCELLANEOUS) ×3 IMPLANT
GAUZE SPONGE 4X4 12PLY STRL (GAUZE/BANDAGES/DRESSINGS) ×3 IMPLANT
GLOVE BIOGEL PI IND STRL 9 (GLOVE) ×1 IMPLANT
GLOVE BIOGEL PI INDICATOR 9 (GLOVE) ×2
GLOVE INDICATOR 8.0 STRL GRN (GLOVE) ×3 IMPLANT
GLOVE SURG ORTHO 8.0 STRL STRW (GLOVE) ×3 IMPLANT
GLOVE SURG SYN 9.0  PF PI (GLOVE) ×2
GLOVE SURG SYN 9.0 PF PI (GLOVE) ×1 IMPLANT
GOWN SRG 2XL LVL 4 RGLN SLV (GOWNS) ×1 IMPLANT
GOWN STRL NON-REIN 2XL LVL4 (GOWNS) ×2
GOWN STRL REUS W/ TWL LRG LVL3 (GOWN DISPOSABLE) ×1 IMPLANT
GOWN STRL REUS W/ TWL XL LVL3 (GOWN DISPOSABLE) ×1 IMPLANT
GOWN STRL REUS W/TWL LRG LVL3 (GOWN DISPOSABLE) ×2
GOWN STRL REUS W/TWL XL LVL3 (GOWN DISPOSABLE) ×2
HOLDER FOLEY CATH W/STRAP (MISCELLANEOUS) ×3 IMPLANT
HOOD PEEL AWAY FLYTE STAYCOOL (MISCELLANEOUS) ×6 IMPLANT
IMMBOLIZER KNEE 19 BLUE UNIV (SOFTGOODS) ×3 IMPLANT
KIT RM TURNOVER STRD PROC AR (KITS) ×3 IMPLANT
KNEE MEDACTA TIBIAL/FEMORAL BL (Knees) ×3 IMPLANT
KNIFE SCULPS 14X20 (INSTRUMENTS) ×3 IMPLANT
NDL SAFETY ECLIPSE 18X1.5 (NEEDLE) ×1 IMPLANT
NEEDLE HYPO 18GX1.5 SHARP (NEEDLE) ×2
NEEDLE SPNL 18GX3.5 QUINCKE PK (NEEDLE) ×3 IMPLANT
NEEDLE SPNL 20GX3.5 QUINCKE YW (NEEDLE) ×3 IMPLANT
NS IRRIG 1000ML POUR BTL (IV SOLUTION) ×3 IMPLANT
PACK TOTAL KNEE (MISCELLANEOUS) ×3 IMPLANT
PAD WRAPON POLAR KNEE (MISCELLANEOUS) ×1 IMPLANT
PULSAVAC PLUS IRRIG FAN TIP (DISPOSABLE) ×3
SOL .9 NS 3000ML IRR  AL (IV SOLUTION) ×2
SOL .9 NS 3000ML IRR UROMATIC (IV SOLUTION) ×1 IMPLANT
STAPLER SKIN PROX 35W (STAPLE) ×3 IMPLANT
SUCTION FRAZIER HANDLE 10FR (MISCELLANEOUS) ×2
SUCTION TUBE FRAZIER 10FR DISP (MISCELLANEOUS) ×1 IMPLANT
SUT DVC 2 QUILL PDO  T11 36X36 (SUTURE) ×2
SUT DVC 2 QUILL PDO T11 36X36 (SUTURE) ×1 IMPLANT
SUT V-LOC 90 ABS DVC 3-0 CL (SUTURE) ×3 IMPLANT
SYR 20CC LL (SYRINGE) ×3 IMPLANT
SYR 50ML LL SCALE MARK (SYRINGE) ×6 IMPLANT
TIBIAL BONE MODEL LEFT (MISCELLANEOUS) IMPLANT
TIP FAN IRRIG PULSAVAC PLUS (DISPOSABLE) ×1 IMPLANT
TOWEL OR 17X26 4PK STRL BLUE (TOWEL DISPOSABLE) ×3 IMPLANT
TOWER CARTRIDGE SMART MIX (DISPOSABLE) ×3 IMPLANT
TRAY FOLEY W/METER SILVER 16FR (SET/KITS/TRAYS/PACK) ×3 IMPLANT
WRAPON POLAR PAD KNEE (MISCELLANEOUS) ×3

## 2017-01-23 NOTE — Op Note (Signed)
01/23/2017  9:44 AM  PATIENT:  Donald [name]  71 y.o. male  PRE-OPERATIVE DIAGNOSIS:  PRIMARY OSTEOARTHRITIS OF LEFT KNEE  POST-OPERATIVE DIAGNOSIS:  PRIMARY OSTEOARTHRITIS OF LEFT KNEE  PROCEDURE:  Procedure(s): TOTAL KNEE ARTHROPLASTY (Left)  SURGEON: Laurene Footman, MD  ASSISTANTS: Rachelle Hora [facility]  ANESTHESIA:   general  EBL:  Total I/O In: 1000 [I.V.:1000] Out: 300 [Urine:200; Blood:100]  BLOOD ADMINISTERED:none  DRAINS: none   LOCAL MEDICATIONS USED:  MARCAINE    and OTHER Morphine, exparel and toradol  SPECIMEN:  No Specimen  DISPOSITION OF SPECIMEN:  N/A  COUNTS:  YES  TOURNIQUET:  * Missing tourniquet times found for documented tourniquets in log: [id number] *53 min at 250 mm Hg   IMPLANTS: Medacta GMK sphere 3 femur, 3 tibia with short stem 10 mm, 3 patella all components cemented  DICTATION: .Dragon Dictation  patient brought the operating room and after adequate general anesthesia was obtainedleftleg was prepped and draped in sterile fashion. After patient identification and timeout procedures were completed a midline skin incision was made followed by medial parapatellar arthrotomy. The knee revealed Extensive erosion of the medial compartment with significant loss of bone as well as advanced patellofemoral degenerative change with exposed bone and mild lateral compartment degenerative change. There is chondrocalcinosis throughout the joint noted. The fat pad and anterior cruciate ligament and PCL were excised and the proximal tibia cutting guide was applied to the proximal tibia and axial tibia cut carried out followed by distal femur cutTourniquet raised to this point secondary to significant oozing from the bone. The 4-in-1 cutting guide was applied to the femur anterior posterior and chamfer cuts made. The posterior horns of the menisci could be resected this time and the size3tibia baseplate trial was placed in apporpriote rotation.. The keel punch was  placed followed by the 64femoral trial and a 10 mm insert gave good stability through range of motion. This was thenchosen as the final implant. Distal femur drill holes were made followed by the trochlear groove cut and then trials were removed. The patella was affected with arthritis as welland resection was carried out drilling plate carried out and then a size 3 patella fit well. These trials were all removed and tourniquet was raised at this point with hemostasis checked with electrocautery. Thelocal anesthetic noted above was infiltrated in the para-articular tissue. The the bony surfaces thoroughly irrigated and dried. Tibial component was cemented in place first followed by the plastic insert and then the femoral component and the knee placed in extension with excess cement being removed. Next the patellar button was clamped into place and after the cement entirely set the clamp was removed the patella tracked well. .the tourniquetwas letdown,the knee was thoroughly irrigated and then closed with a heavy Quill with 3-0 locking suture followed by skin staples., Xeroform 4 x 4's ABDs and web roll and Ace wrap along with a Polar Care     PLAN OF CARE: Admit to inpatient   PATIENT DISPOSITION:  PACU - hemodynamically stable.

## 2017-01-23 NOTE — NC FL2 (Signed)
[place] LEVEL OF CARE SCREENING TOOL     IDENTIFICATION  Patient Name: Donald [name] Birthdate: March 16, 1945 Sex: male Admission Date (Current Location): 01/23/2017  [employer] and [place] Number:  Engineering geologist and Address:  [facility], [address] [place], [place] [address]      Provider Number: [id number]  Attending Physician Name and Address:  Hessie Knows, MD  Relative Name and Phone Number:       Current Level of Care: Hospital Recommended Level of Care: [place] Prior Approval Number:    Date Approved/Denied:   PASRR Number: ([id number] )  Discharge Plan: SNF    Current Diagnoses: Patient Active Problem List   Diagnosis Date Noted  . Primary osteoarthritis of left knee 01/23/2017  . Hypertension 07/14/2014  . GERD (gastroesophageal reflux disease) 07/14/2014  . Hyperlipidemia 07/14/2014  . Osteoarthritis 07/14/2014  . Gastric ulcer 07/14/2014  . Abdominal pain, epigastric 07/14/2014    Orientation RESPIRATION BLADDER Height & Weight     Self, Time, Situation, Place  Normal Continent Weight: 132 lb (59.9 kg) Height:  5\' 9"  (175.3 cm)  BEHAVIORAL SYMPTOMS/MOOD NEUROLOGICAL BOWEL NUTRITION STATUS      Continent Diet(Diet: Regular )  AMBULATORY STATUS COMMUNICATION OF NEEDS Skin   Extensive Assist Verbally Surgical wounds(Incision: Right Knee. )                       Personal Care Assistance Level of Assistance  Bathing, Feeding, Dressing Bathing Assistance: Limited assistance Feeding assistance: Independent Dressing Assistance: Limited assistance     Functional Limitations Info  Sight, Hearing, Speech Sight Info: Adequate Hearing Info: Adequate Speech Info: Adequate    SPECIAL CARE FACTORS FREQUENCY  PT (By licensed PT), OT (By licensed OT)     PT Frequency: (5) OT Frequency: (5)            Contractures      Additional Factors Info  Code Status, Allergies Code  Status Info: (Full Code. ) Allergies Info: (Ciprofloxacin, Flagyl Metronidazole)           Current Medications (01/23/2017):  This is the current hospital active medication list Current Facility-Administered Medications  Medication Dose Route Frequency Provider Last Rate Last Dose  . 0.9 %  sodium chloride infusion   Intravenous Continuous Hessie Knows, MD 75 mL/hr at 01/23/17 1140    . acetaminophen (TYLENOL) tablet 650 mg  650 mg Oral Q4H PRN Hessie Knows, MD       Or  . acetaminophen (TYLENOL) suppository 650 mg  650 mg Rectal Q4H PRN Hessie Knows, MD      . alfuzosin (UROXATRAL) 24 hr tablet 10 mg  10 mg Oral QHS Hessie Knows, MD      . amLODipine (NORVASC) tablet 5 mg  5 mg Oral Daily Hessie Knows, MD      . aspirin EC tablet 81 mg  81 mg Oral Daily Larene Beach, [facility]      . atorvastatin (LIPITOR) tablet 80 mg  80 mg Oral Daily Hessie Knows, MD      . bisacodyl (DULCOLAX) suppository 10 mg  10 mg Rectal Daily PRN Hessie Knows, MD      . carvedilol (COREG) tablet 3.125 mg  3.125 mg Oral BID WC Hessie Knows, MD      . ceFAZolin (ANCEF) 1 g in dextrose 5 % 50 mL IVPB  1 g Intravenous Q6H Hessie Knows, MD 120 mL/hr at 01/23/17 1440 1  g at 01/23/17 1440  . clonazePAM (KLONOPIN) tablet 1 mg  1 mg Oral QHS Hessie Knows, MD      . diclofenac (VOLTAREN) EC tablet 75 mg  75 mg Oral BID Hessie Knows, MD      . diphenhydrAMINE (BENADRYL) 12.5 MG/5ML elixir 12.5-25 mg  12.5-25 mg Oral Q4H PRN Hessie Knows, MD      . docusate sodium (COLACE) capsule 100 mg  100 mg Oral BID Hessie Knows, MD      . Derrill Memo ON 01/24/2017] enoxaparin (LOVENOX) injection 30 mg  30 mg Subcutaneous Q12H Hessie Knows, MD      . Derrill Memo ON 01/24/2017] ferrous sulfate tablet 325 mg  325 mg Oral Q breakfast Hessie Knows, MD      . HYDROmorphone (DILAUDID) 1 MG/ML injection           . lisinopril (PRINIVIL,ZESTRIL) tablet 10 mg  10 mg Oral Daily Hessie Knows, MD      . magnesium citrate solution 1 Bottle  1  Bottle Oral Once PRN Hessie Knows, MD      . magnesium hydroxide (MILK OF MAGNESIA) suspension 30 mL  30 mL Oral Daily PRN Hessie Knows, MD      . menthol-cetylpyridinium (CEPACOL) lozenge 3 mg  1 lozenge Oral PRN Hessie Knows, MD       Or  . phenol (CHLORASEPTIC) mouth spray 1 spray  1 spray Mouth/Throat PRN Hessie Knows, MD      . methocarbamol (ROBAXIN) tablet 500 mg  500 mg Oral Q6H PRN Hessie Knows, MD       Or  . methocarbamol (ROBAXIN) 500 mg in dextrose 5 % 50 mL IVPB  500 mg Intravenous Q6H PRN Hessie Knows, MD      . metoCLOPramide (REGLAN) tablet 5-10 mg  5-10 mg Oral Q8H PRN Hessie Knows, MD       Or  . metoCLOPramide (REGLAN) injection 5-10 mg  5-10 mg Intravenous Q8H PRN Hessie Knows, MD      . morphine 2 MG/ML injection 2 mg  2 mg Intravenous Q1H PRN Hessie Knows, MD      . omega-3 acid ethyl esters (LOVAZA) capsule 1 g  1 g Oral Daily Hessie Knows, MD      . ondansetron [facility]) tablet 4 mg  4 mg Oral Q6H PRN Hessie Knows, MD       Or  . ondansetron [facility]) injection 4 mg  4 mg Intravenous Q6H PRN Hessie Knows, MD      . oxyCODONE (Oxy IR/ROXICODONE) immediate release tablet 10 mg  10 mg Oral Q3H PRN Hessie Knows, MD   10 mg at 01/23/17 1440  . oxyCODONE (Oxy IR/ROXICODONE) immediate release tablet 5 mg  5 mg Oral Q3H PRN Hessie Knows, MD      . pantoprazole (PROTONIX) EC tablet 40 mg  40 mg Oral BID Hessie Knows, MD      . vitamin C (ASCORBIC ACID) tablet 500 mg  500 mg Oral Daily Hessie Knows, MD   500 mg at 01/23/17 1224  . zolpidem (AMBIEN) tablet 5 mg  5 mg Oral QHS PRN Hessie Knows, MD         Discharge Medications: Please see discharge summary for a list of discharge medications.  Relevant Imaging Results:  Relevant Lab Results:   Additional Information (SSN: [id number])  [name], Veronia Beets, LCSW

## 2017-01-23 NOTE — Anesthesia Preprocedure Evaluation (Signed)
Anesthesia Evaluation  Patient identified by MRN, date of birth, ID band Patient awake    Reviewed: Allergy & Precautions, NPO status , Patient's Chart, lab work & pertinent test results  History of Anesthesia Complications Negative for: history of anesthetic complications  Airway Mallampati: II       Dental   Pulmonary neg sleep apnea, neg COPD, former smoker,           Cardiovascular hypertension, Pt. on medications and Pt. on home beta blockers + CAD and + Cardiac Stents  (-) CHF (-) dysrhythmias (-) Valvular Problems/Murmurs     Neuro/Psych neg Seizures    GI/Hepatic Neg liver ROS, PUD, GERD  Medicated and Controlled,  Endo/Other  neg diabetes  Renal/GU negative Renal ROS     Musculoskeletal   Abdominal   Peds  Hematology   Anesthesia Other Findings   Reproductive/Obstetrics                             Anesthesia Physical Anesthesia Plan  ASA: III  Anesthesia Plan: Spinal   Post-op Pain Management:    Induction:   PONV Risk Score and Plan:   Airway Management Planned: Nasal Cannula  Additional Equipment:   Intra-op Plan:   Post-operative Plan:   Informed Consent: I have reviewed the patients History and Physical, chart, labs and discussed the procedure including the risks, benefits and alternatives for the proposed anesthesia with the patient or authorized representative who has indicated his/her understanding and acceptance.     Plan Discussed with:   Anesthesia Plan Comments:         Anesthesia Quick Evaluation

## 2017-01-23 NOTE — Anesthesia Post-op Follow-up Note (Signed)
Anesthesia QCDR form completed.        

## 2017-01-23 NOTE — Care Management Note (Signed)
Case Management Note  Patient Details  Name: Donald [name] MRN: [id number] Date of Birth: [birth year]  Subjective/Objective:   Left message at the [facility] regarding patient. Awaiting return call.                  Action/Plan:   Expected Discharge Date:                  Expected Discharge Plan:     In-House Referral:     Discharge planning Services     Post Acute Care Choice:    Choice offered to:     DME Arranged:    DME Agency:     HH Arranged:    HH Agency:     Status of Service:     If discussed at [employer] of Stay Meetings, dates discussed:    Additional Comments:  [name], RN 01/23/2017, 11:33 AM

## 2017-01-23 NOTE — Progress Notes (Signed)
15 minute call to floor. 

## 2017-01-23 NOTE — Anesthesia Procedure Notes (Signed)
Procedure Name: Intubation Performed by: Rolla Plate, CRNA Pre-anesthesia Checklist: Patient identified, Patient being monitored, Timeout performed, Emergency Drugs available and Suction available Patient Re-evaluated:Patient Re-evaluated prior to induction Oxygen Delivery Method: Circle system utilized Preoxygenation: Pre-oxygenation with 100% oxygen Induction Type: IV induction Ventilation: Mask ventilation without difficulty Laryngoscope Size: Miller and 2 Grade View: Grade I Tube type: Oral Tube size: 7.5 mm Number of attempts: 1 Placement Confirmation: ETT inserted through vocal cords under direct vision,  positive ETCO2 and breath sounds checked- equal and bilateral Secured at: 21 cm Tube secured with: Tape Dental Injury: Teeth and Oropharynx as per pre-operative assessment

## 2017-01-23 NOTE — Evaluation (Signed)
Physical Therapy Evaluation Patient Details Name: Donald [name] MRN: [id number] DOB: [birth year] Today's Date: 01/23/2017   History of Present Illness  admitted for acute hospitalization status post L TKR (01/23/17), WBAT.  Clinical Impression  Upon evaluation, patient alert and oriented; follows all commands and demonstrates excellent effort with all functioal activities.  L knee strength at least 3-/5, ROM 5-63 degrees; limited by pain.  Good L quad activation and control with isolated therex (with provided with increased time); minimal quad lag noted with SLR.  Currently completes bed mobility with mod indep; sit/stand, basic transfers and gait (25') with RW, cga.  Min cuing for L TKE in loading phases, but good stability throughout.  Will continue to progress gait distance as able. Anticipate consistent progression towards all mobility goals. Would benefit from skilled PT to address above deficits and promote optimal return to PLOF; Recommend transition to [place] upon discharge from acute hospitalization.  Will continue to monitor and upgrade to reflect progress as appropriate.     Follow Up Recommendations Home health PT    Equipment Recommendations  Rolling walker with 5" wheels    Recommendations for Other Services       Precautions / Restrictions Precautions Precautions: Fall Restrictions Weight Bearing Restrictions: Yes LLE Weight Bearing: Weight bearing as tolerated      Mobility  Bed Mobility Overal bed mobility: Modified Independent             General bed mobility comments: increased time required  Transfers Overall transfer level: Needs assistance Equipment used: Rolling walker (2 wheeled) Transfers: Sit to/from Stand Sit to Stand: Min assist         General transfer comment: cuing for hand placement, excessive weight shift to R LE  Ambulation/Gait Ambulation/Gait assistance: Min assist Ambulation Distance (Feet): 25 Feet Assistive device: Rolling  walker (2 wheeled)       General Gait Details: step to gait pattern with fair stance time/weight acceptance to L LE; min cuing for L TKE in loading.  Good knee control, good effort with all mobility.  Stairs            Wheelchair Mobility    Modified Rankin (Stroke Patients Only)       Balance Overall balance assessment: Needs assistance Sitting-balance support: No upper extremity supported;Feet supported Sitting balance-Leahy Scale: Good     Standing balance support: Bilateral upper extremity supported Standing balance-Leahy Scale: Fair                               Pertinent Vitals/Pain Pain Assessment: 0-10 Pain Score: 9  Pain Location: L knee Pain Descriptors / Indicators: Aching;Grimacing;Guarding Pain Intervention(s): Limited activity within patient's tolerance;Monitored during session;Repositioned;Patient requesting pain meds-RN notified;RN gave pain meds during session    [place] expects to be discharged to:: Private residence Living Arrangements: Spouse/significant other Available Help at Discharge: Family Type of Home: House Home Access: Stairs to enter Entrance Stairs-Rails: None Entrance Stairs-Number of Steps: 3 Home Layout: One level Home Equipment: Cane - single point      Prior Function Level of Independence: Independent with assistive device(s)         Comments: Mod indep for ADLs, household and community mobilization with [facility]; denies fall history.     Hand Dominance        Extremity/Trunk Assessment   Upper Extremity Assessment Upper Extremity Assessment: Overall WFL for tasks assessed    Lower Extremity Assessment Lower  Extremity Assessment: (L knee grossly 3-/5, limited by pain; otherwise, grossly WFL. Sensation fully intact)       Communication   Communication: No difficulties  Cognition Arousal/Alertness: Awake/alert Behavior During Therapy: WFL for tasks assessed/performed Overall  Cognitive Status: Within Functional Limits for tasks assessed                                        General Comments      Exercises Total Joint Exercises Goniometric ROM: L knee: 6-63 degrees, limited by pain Other Exercises Other Exercises: Supine LE therex, 1x10, AROM for muscular strength/endurance with functional activities: ankle pumps, quad sets, SAQs, heel slides, hip abduct/adduct and SLR.  Good L quad activation (with increased time); good control with minimal lag noted during SLR.   Assessment/Plan    PT Assessment Patient needs continued PT services  PT Problem List Decreased strength;Decreased activity tolerance;Decreased balance;Decreased range of motion;Decreased mobility;Decreased knowledge of use of DME;Decreased safety awareness;Decreased knowledge of precautions;Decreased skin integrity;Pain       PT Treatment Interventions DME instruction;Gait training;Stair training;Functional mobility training;Therapeutic activities;Therapeutic exercise;Balance training;Patient/family education    PT Goals (Current goals can be found in the Care Plan section)  Acute Rehab PT Goals Patient Stated Goal: to do what I have to do  PT Goal Formulation: With patient Time For Goal Achievement: 02/06/17 Potential to Achieve Goals: Good    Frequency BID   Barriers to discharge        Co-evaluation               AM-PAC PT "6 Clicks" Daily Activity  Outcome Measure Difficulty turning over in bed (including adjusting bedclothes, sheets and blankets)?: A Little Difficulty moving from lying on back to sitting on the side of the bed? : A Little Difficulty sitting down on and standing up from a chair with arms (e.g., wheelchair, bedside commode, etc,.)?: Unable Help needed moving to and from a bed to chair (including a wheelchair)?: A Little Help needed walking in hospital room?: A Little Help needed climbing 3-5 steps with a railing? : A Little 6 Click Score:  16    End of Session Equipment Utilized During Treatment: Gait belt Activity Tolerance: Patient tolerated treatment well Patient left: in chair;with call bell/phone within reach;with chair alarm set;with family/visitor present Nurse Communication: Mobility status PT Visit Diagnosis: Muscle weakness (generalized) (M62.81);Difficulty in walking, not elsewhere classified (R26.2);Pain Pain - Right/Left: Left Pain - part of body: Knee    Time: 1950-9326 PT Time Calculation (min) (ACUTE ONLY): 30 min   Charges:   PT Evaluation $PT Eval Low Complexity: 1 Low PT Treatments $Therapeutic Exercise: 8-22 mins   PT G Codes:   PT G-Codes **NOT FOR INPATIENT CLASS** Functional Assessment Tool Used: AM-PAC 6 Clicks Basic Mobility Functional Limitation: Mobility: Walking and moving around Mobility: Walking and Moving Around Current Status ([id number]): At least 20 percent but less than 40 percent impaired, limited or restricted Mobility: Walking and Moving Around Goal Status [phone]): At least 1 percent but less than 20 percent impaired, limited or restricted    Lior Hoen H. Owens Shark, PT, DPT, NCS 01/23/17, 3:58 PM [phone]

## 2017-01-23 NOTE — Progress Notes (Signed)
Good strong left pedal pulse, patient ale to move extremity Color pink, warm to touch.

## 2017-01-23 NOTE — H&P (Signed)
Reviewed paper H+P, will be scanned into chart. No changes noted.  

## 2017-01-23 NOTE — Transfer of Care (Signed)
Immediate Anesthesia Transfer of Care Note  Patient: Donald [name]  Procedure(s) Performed: TOTAL KNEE ARTHROPLASTY (Left )  Patient Location: PACU  Anesthesia Type:General  Level of Consciousness: sedated  Airway & Oxygen Therapy: Patient Spontanous Breathing and Patient connected to face mask oxygen  Post-op Assessment: Report given to RN and Post -op Vital signs reviewed and stable  Post vital signs: Reviewed  Last Vitals:  Vitals:   01/23/17 0624 01/23/17 0944  BP: 133/66 139/71  Pulse: 70 79  Resp: 17 12  Temp: 36.6 C (!) 36.2 C  SpO2: 99% 100%    Last Pain:  Vitals:   01/23/17 0624  TempSrc: Oral  PainSc: 10-Worst pain ever         Complications: No apparent anesthesia complications

## 2017-01-24 ENCOUNTER — Encounter: Payer: Self-pay | Admitting: Orthopedic Surgery

## 2017-01-24 LAB — BASIC METABOLIC PANEL
Anion gap: 6 (ref 5–15)
BUN: 12 mg/dL (ref 6–20)
CHLORIDE: 105 mmol/L (ref 101–111)
CO2: 25 mmol/L (ref 22–32)
CREATININE: 0.75 mg/dL (ref 0.61–1.24)
Calcium: 8.5 mg/dL — ABNORMAL LOW (ref 8.9–10.3)
GFR calc Af Amer: >60 mL/min (ref >60)
GFR calc non Af Amer: >60 mL/min (ref >60)
Glucose, Bld: 167 mg/dL — ABNORMAL HIGH (ref 65–99)
Potassium: 4 mmol/L (ref 3.5–5.1)
SODIUM: 136 mmol/L (ref 135–145)

## 2017-01-24 LAB — CBC
HCT: 30.5 % — ABNORMAL LOW (ref 40.0–52.0)
HEMOGLOBIN: 10.5 g/dL — AB (ref 13.0–18.0)
MCH: 31.7 pg (ref 26.0–34.0)
MCHC: 34.5 g/dL (ref 32.0–36.0)
MCV: 92 fL (ref 80.0–100.0)
Platelets: 187 10*3/uL (ref 150–440)
RBC: 3.32 MIL/uL — ABNORMAL LOW (ref 4.40–5.90)
RDW: 12.8 % (ref 11.5–14.5)
WBC: 9.9 10*3/uL (ref 3.8–10.6)

## 2017-01-24 LAB — TYPE AND SCREEN
ABO/RH(D): B NEG
ANTIBODY SCREEN: POSITIVE
UNIT DIVISION: 0
Unit division: 0

## 2017-01-24 LAB — BPAM RBC
BLOOD PRODUCT EXPIRATION DATE: 201901172359
Blood Product Expiration Date: 201901212359
UNIT TYPE AND RH: 9500
Unit Type and Rh: 9500

## 2017-01-24 MED ORDER — ADULT MULTIVITAMIN W/MINERALS CH
1.0000 | ORAL_TABLET | Freq: Every day | ORAL | Status: DC
  Administered 2017-01-25: 1 via ORAL
  Filled 2017-01-24: qty 1

## 2017-01-24 MED ORDER — ENSURE ENLIVE PO LIQD: 237.0000 mL | Freq: Two times a day (BID) | ORAL | Status: DC

## 2017-01-24 NOTE — Progress Notes (Signed)
Initial Nutrition Assessment  DOCUMENTATION CODES:   Severe malnutrition in context of chronic illness  INTERVENTION:  Provide Ensure Enlive po BID, each supplement provides 350 kcal and 20 grams of protein.  Provide multivitamin with minerals daily.  NUTRITION DIAGNOSIS:   Severe Malnutrition related to chronic illness(OA, pain, nausea) as evidenced by moderate fat depletion, severe fat depletion, moderate muscle depletion, severe muscle depletion.  GOAL:   Patient will meet greater than or equal to 90% of their needs  MONITOR:   PO intake, Supplement acceptance, Labs, Weight trends, Skin, I & O's  REASON FOR ASSESSMENT:   Malnutrition Screening Tool    ASSESSMENT:   71 year old male with PMHx of HTN, GERD, HLD, arthritis, CAD admitted with osteoarthritis of left knee s/p left TKA 12/18.   Met with patient at bedside. He reports he has had a decreased appetite for a while now. He believes it is because he gets nausea from the pain in his knees. He is hopeful that now that his knees are being fixed he will no longer have issues with this. He has previous met with RDs at the [facility] hospital who have helped him with eating high-calorie, high-protein foods to gain weight. He eats granola bars, protein bars, tuna, fish, eggs, milk, and Ensure BID.  UBW 140 lbs. RD obtained bed scale weight of 134.9 lbs. Patient reports it has been a while since he was 140 lbs. Per chart he is actually gaining weight this past year.  Meal Completion: 90-100%  Medications reviewed and include: Colace, ferrous sulfate 325 mg daily, Lovaza 1 gram daily, pantoprazole, vitamin C 500 mg daily, NS @ 75 mL/hr.  Labs reviewed: Glucose 167.  Discussed with RN.  NUTRITION - FOCUSED PHYSICAL EXAM:    Most Recent Value  Orbital Region  Severe depletion  Upper Arm Region  Severe depletion  Thoracic and Lumbar Region  Moderate depletion  Buccal Region  Severe depletion  Temple Region  Severe depletion   Clavicle Bone Region  Moderate depletion  Clavicle and Acromion Bone Region  Moderate depletion  Scapular Bone Region  Moderate depletion  Dorsal Hand  Moderate depletion  Patellar Region  Severe depletion  Anterior Thigh Region  Severe depletion  Posterior Calf Region  Moderate depletion  Edema (RD Assessment)  None  Hair  Reviewed  Eyes  Reviewed  Mouth  Reviewed  Skin  Reviewed  Nails  Reviewed     Diet Order:  Diet regular Room service appropriate? Yes; Fluid consistency: Thin  EDUCATION NEEDS:   No education needs have been identified at this time  Skin:  Skin Assessment: Skin Integrity Issues: Skin Integrity Issues:: Incisions Incisions: closed incision left knee  Last BM:  01/21/2017  Height:   Ht Readings from Last 1 Encounters:  01/23/17 '5\' 9"'$  (1.753 m)    Weight:   Wt Readings from Last 1 Encounters:  01/23/17 132 lb (59.9 kg)    Ideal Body Weight:  72.7 kg  BMI:  Body mass index is 19.49 kg/m.  Estimated Nutritional Needs:   Kcal:  1615-1880 (MSJ x 1.2-1.4)  Protein:  72-84 grams (1.2-1.4 grams/kg)  Fluid:  1.5-1.8 L/day (25-30 mL/kg)  Willey Blade, MS, RD, LDN Office: [phone] Pager: [phone] After Hours/Weekend Pager: [phone]

## 2017-01-24 NOTE — Clinical Social Work Note (Signed)
CSW received referral for SNF.  Case discussed with case manager and plan is to discharge home with home health.  CSW to sign off please re-consult if social work needs arise.  Ikechukwu Cerny R. Aalina Brege, MSW, LCSWA [phone]  

## 2017-01-24 NOTE — Progress Notes (Signed)
Physical Therapy Treatment Patient Details Name: Donald [name] MRN: [id number] DOB: [birth year] Today's Date: 01/24/2017    History of Present Illness admitted for acute hospitalization status post L TKR (01/23/17), WBAT.    PT Comments    Pt agreeable to PT; reports 6-7 pain in left knee. Improving flexion range post stretch this session; however, decreased extension. It is noted that bed looked locked with some bend at lower part; corrected as able. Education and encouraged gravity assisted extension stretch. Pt progressing ambulation distance with education on improved quality to step through and increased flexion on left swing phase; inconsistent at this time. Rolling walker adjusted for appropriate height prior to gait. Education with practice on steps both backwards and side stepping for options depending on which steps pt with use at home. Continue PT to progress strength, range and endurance to improve quality of gait and all functional mobility.    Follow Up Recommendations  Home health PT     Equipment Recommendations  Rolling walker with 5" wheels    Recommendations for Other Services       Precautions / Restrictions Precautions Precautions: Fall Restrictions Weight Bearing Restrictions: Yes LLE Weight Bearing: Weight bearing as tolerated    Mobility  Bed Mobility Overal bed mobility: Modified Independent             General bed mobility comments: increased time/effort; assist only to remove bed clothes from L foot  Transfers Overall transfer level: Needs assistance Equipment used: Rolling walker (2 wheeled) Transfers: Sit to/from Stand Sit to Stand: Min guard         General transfer comment: cues for hand placement and greater use of L knee.   Ambulation/Gait Ambulation/Gait assistance: Min guard Ambulation Distance (Feet): 95 Feet Assistive device: Rolling walker (2 wheeled) Gait Pattern/deviations: Step-to pattern;Step-through pattern(stiff L  leg)   Gait velocity interpretation: <1.8 ft/sec, indicative of risk for recurrent falls General Gait Details: Rw lowered for improved/appropriate fit with noted improved comfort and ability to de weight LLE. Initial step to progressing to inconsistent step to/through with education. Stiff left leg, but unable to extend knee fully. Cues for L knee flexion with swing phase   Stairs Stairs: Yes   Stair Management: One rail Left;Step to pattern;Sideways(also performed bkwds with rw ) Number of Stairs: 4(performed twice) General stair comments: Educated in both bkwd with rw and perferable sideways with L rail. Pt will have more steps at home to do this way (10), but feels safe/less fear of falling. Therapist agrees.   Wheelchair Mobility    Modified Rankin (Stroke Patients Only)       Balance Overall balance assessment: Needs assistance Sitting-balance support: No upper extremity supported;Feet supported Sitting balance-Leahy Scale: Good     Standing balance support: Bilateral upper extremity supported Standing balance-Leahy Scale: Fair                              Cognition Arousal/Alertness: Awake/alert Behavior During Therapy: WFL for tasks assessed/performed Overall Cognitive Status: Within Functional Limits for tasks assessed                                        Exercises Total Joint Exercises Quad Sets: Strengthening;Both;20 reps(also in stand 10x prior to gait) Knee Flexion: AROM;Left;10 reps;Seated(3 positions each rep) Goniometric ROM: 8-78(bed appeared locked with some bend; corrected as  able.) Other Exercises Other Exercises: Educated and stressed work on extension and flexion and how to at home. Educated in increased use of LLE with sit/stand for stretch/strengthen opportunity    General Comments        Pertinent Vitals/Pain Pain Assessment: 0-10 Pain Score: 7  Pain Location: L knee Pain Descriptors / Indicators:  Guarding;Operative site guarding Pain Intervention(s): Monitored during session;Premedicated before session;Repositioned;Ice applied    Home Living Family/patient expects to be discharged to:: Private residence Living Arrangements: Spouse/significant other Available Help at Discharge: Family Type of Home: House Home Access: Stairs to enter Entrance Stairs-Rails: None Home Layout: One level Home Equipment: Cane - single point;Hand held shower head;Adaptive equipment      Prior Function Level of Independence: Independent with assistive device(s)      Comments: Mod indep for ADLs, household and community mobilization with [facility]; denies fall history. Increased difficulty lately with toilet and tub transfers due to bilat knee pain   PT Goals (current goals can now be found in the care plan section) Acute Rehab PT Goals Patient Stated Goal: go home and get stronger Progress towards PT goals: Progressing toward goals    Frequency    BID      PT Plan Current plan remains appropriate    Co-evaluation              AM-PAC PT "6 Clicks" Daily Activity  Outcome Measure  Difficulty turning over in bed (including adjusting bedclothes, sheets and blankets)?: A Little Difficulty moving from lying on back to sitting on the side of the bed? : A Little Difficulty sitting down on and standing up from a chair with arms (e.g., wheelchair, bedside commode, etc,.)?: Unable Help needed moving to and from a bed to chair (including a wheelchair)?: A Little Help needed walking in hospital room?: A Little Help needed climbing 3-5 steps with a railing? : A Little 6 Click Score: 16    End of Session Equipment Utilized During Treatment: Gait belt Activity Tolerance: Patient tolerated treatment well Patient left: in chair;with call bell/phone within reach;with chair alarm set;Other (comment)(polar care in place)   PT Visit Diagnosis: Muscle weakness (generalized) (M62.81);Difficulty in walking,  not elsewhere classified (R26.2);Pain Pain - Right/Left: Left Pain - part of body: Knee     Time: 1021-1100 PT Time Calculation (min) (ACUTE ONLY): 39 min  Charges:  $Gait Training: 23-37 mins $Therapeutic Exercise: 8-22 mins                    G Codes:        Larae Grooms, PTA 01/24/2017, 11:45 AM

## 2017-01-24 NOTE — Progress Notes (Addendum)
Physical Therapy Treatment Patient Details Name: Donald [name] MRN: [id number] DOB: Oct 19, 1945 Today's Date: 01/24/2017    History of Present Illness admitted for acute hospitalization status post L TKR (01/23/17), WBAT.    PT Comments    Pt agreeable to PT; continues with left knee pain 7/10 with ambulation. Progressing distance; continues to require cues for improved sequence quality for rolling walker placement and equal step lengths. Pt education again regarding use of Left lower extremity for STS transfers and for quad sets to improve strength ( pt questions why he is unable to lift/move leg on his own) Pt states better understanding post education. Continue PT to progress quality of gait for improved functional mobility and allow for an optimal, safe return home.    Follow Up Recommendations  Home health PT     Equipment Recommendations  Rolling walker with 5" wheels    Recommendations for Other Services       Precautions / Restrictions Precautions Precautions: Fall Restrictions Weight Bearing Restrictions: Yes LLE Weight Bearing: Weight bearing as tolerated    Mobility  Bed Mobility Overal bed mobility: Modified Independent             General bed mobility comments: self assist for LLE with hook technique  Transfers Overall transfer level: Needs assistance Equipment used: Rolling walker (2 wheeled) Transfers: Sit to/from Stand Sit to Stand: Min guard         General transfer comment: Continued cues for improved use of LLE  Ambulation/Gait Ambulation/Gait assistance: Min guard Ambulation Distance (Feet): 100 Feet Assistive device: Rolling walker (2 wheeled) Gait Pattern/deviations: Step-to pattern;Step-through pattern(stiff L leg)   Gait velocity interpretation: <1.8 ft/sec, indicative of risk for recurrent falls General Gait Details: Continues to require cues for improved reciprocal pattern and rw placement to allow for reciprocal gait without  walking past hands.    Stairs Stairs: Yes   Stair Management: One rail Left;Step to pattern;Sideways(also performed bkwds with rw ) Number of Stairs: 4(performed twice) General stair comments: Educated in both bkwd with rw and perferable sideways with L rail. Pt will have more steps at home to do this way (10), but feels safe/less fear of falling. Therapist agrees.   Wheelchair Mobility    Modified Rankin (Stroke Patients Only)       Balance Overall balance assessment: Needs assistance         Standing balance support: Bilateral upper extremity supported Standing balance-Leahy Scale: Fair                              Cognition Arousal/Alertness: Awake/alert Behavior During Therapy: WFL for tasks assessed/performed Overall Cognitive Status: Within Functional Limits for tasks assessed                                        Exercises Total Joint Exercises Quad Sets: Strengthening;Both;20 reps(also in stand 10x prior to gait) Knee Flexion: AROM;Left;10 reps;Seated;AAROM(3 positions each rep, 10 sec hold each. Self assist with RLE) Goniometric ROM: 8-78(bed appeared locked with some bend; corrected as able.) Other Exercises Other Exercises: Educated and stressed work on extension and flexion and how to at home. Educated in increased use of LLE with sit/stand for stretch/strengthen opportunity    General Comments        Pertinent Vitals/Pain Pain Assessment: 0-10 Pain Score: 7  Pain Location: L knee  Pain Descriptors / Indicators: Guarding;Operative site guarding Pain Intervention(s): Monitored during session;Premedicated before session;Repositioned;Ice applied    Home Living                      Prior Function            PT Goals (current goals can now be found in the care plan section) Progress towards PT goals: Progressing toward goals    Frequency    BID      PT Plan Current plan remains appropriate     Co-evaluation              AM-PAC PT "6 Clicks" Daily Activity  Outcome Measure  Difficulty turning over in bed (including adjusting bedclothes, sheets and blankets)?: A Little Difficulty moving from lying on back to sitting on the side of the bed? : A Little Difficulty sitting down on and standing up from a chair with arms (e.g., wheelchair, bedside commode, etc,.)?: Unable Help needed moving to and from a bed to chair (including a wheelchair)?: A Little Help needed walking in hospital room?: A Little Help needed climbing 3-5 steps with a railing? : A Little 6 Click Score: 16    End of Session Equipment Utilized During Treatment: Gait belt Activity Tolerance: Patient tolerated treatment well Patient left: in bed;with call bell/phone within reach(polar care in place, declined alarm)   PT Visit Diagnosis: Muscle weakness (generalized) (M62.81);Difficulty in walking, not elsewhere classified (R26.2);Pain Pain - Right/Left: Left Pain - part of body: Knee     Time: 6387-5643 PT Time Calculation (min) (ACUTE ONLY): 39 min  Charges:  $Gait Training: 8-22 mins $Therapeutic Exercise: 8-22 mins                    G Codes:        Larae Grooms, PTA 01/24/2017, 2:12 PM

## 2017-01-24 NOTE — Care Management (Addendum)
Left message with [name] and [name] [phone] at [facility] for return call.

## 2017-01-24 NOTE — Evaluation (Signed)
Occupational Therapy Evaluation Patient Details Name: Donald [name] MRN: [id number] DOB: [birth year] Today's Date: 01/24/2017    History of Present Illness admitted for acute hospitalization status post L TKR (01/23/17), WBAT.   Clinical Impression   Pt seen for OT evaluation this date. Pt was modified independent using SPC for mobility and increasing difficulty performing LB dressing and tub/toilet transfers due to bilateral knee pain, L>R. Pt endorses he has been increasingly limited by pain with mobility and participation in ADL and IADL tasks and plans to have R knee replacement performed as soon as he is well enough. Pt presents with L knee pain, decreased LLE strength/ROM, impaired activity tolerance, decreased knowledge of AE/DME, and increased need for assist with LB ADL tasks and functional transfers. Pt/family educated in use of AE for LB dressing, use of urinal for overnight toileting, use of BSC over low commode at home to improve independence and safety with toilet transfers, and as shower chair for bathing to conserve energy and minimize falls risk. Pt educated in polar care mgt and compression stocking mgt strategies to improve ease of management. Pt declined to trial use of AE for LB dressing this session due to pain, but agreeable to trialing next session. Pt would benefit from skilled OT services to address noted impairments and functional deficits in order to maximize return to PLOF and minimize risk of falls and functional decline. Recommend HHOT services following hospitalization.     Follow Up Recommendations  Home health OT    Equipment Recommendations  3 in 1 bedside commode    Recommendations for Other Services       Precautions / Restrictions Precautions Precautions: Fall Restrictions Weight Bearing Restrictions: Yes LLE Weight Bearing: Weight bearing as tolerated      Mobility Bed Mobility Overal bed mobility: Modified Independent             General  bed mobility comments: additional time/effort noted, pain meds not fully "kicked in"  Transfers Overall transfer level: Needs assistance Equipment used: Rolling walker (2 wheeled) Transfers: Sit to/from Stand Sit to Stand: Min assist         General transfer comment: verbal cues for hand placement    Balance Overall balance assessment: Needs assistance Sitting-balance support: No upper extremity supported;Feet supported Sitting balance-Leahy Scale: Good     Standing balance support: Bilateral upper extremity supported Standing balance-Leahy Scale: Fair                             ADL either performed or assessed with clinical judgement   ADL Overall ADL's : Needs assistance/impaired     Grooming: Standing;Supervision/safety   Upper Body Bathing: Sitting;Set up   Lower Body Bathing: Sit to/from stand;Minimal assistance Lower Body Bathing Details (indicate cue type and reason): pt educated in seated shower and AE to improve safety and independence Upper Body Dressing : Sitting;Set up   Lower Body Dressing: Sit to/from stand;Minimal assistance Lower Body Dressing Details (indicate cue type and reason): pt educated in use of AE for LB dressing with verbal instruction and visual demonstration, pt declined to trial this date due to pain, agreeable to trialing next session Toilet Transfer: Min Systems developer Details (indicate cue type and reason): pt educated in DME/AE to increase height of toilet to improve safety and functional independence, decreasing energy expenditure and minimizing pain       Tub/Shower Transfer Details (indicate cue type and reason): pt educated in  shower precautions and adaptive strategies to bathe seated on commode for sponge bath while unable to shower Functional mobility during ADLs: Min guard;Rolling walker       Vision Baseline Vision/History: Wears glasses Wears Glasses: Reading only Patient Visual Report:  No change from baseline Vision Assessment?: No apparent visual deficits     Perception     Praxis      Pertinent Vitals/Pain Pain Assessment: 0-10 Pain Score: 7  Pain Location: L knee Pain Descriptors / Indicators: Aching;Grimacing;Guarding Pain Intervention(s): Limited activity within patient's tolerance;Monitored during session;Premedicated before session;Ice applied     Hand Dominance     Extremity/Trunk Assessment Upper Extremity Assessment Upper Extremity Assessment: Overall WFL for tasks assessed   Lower Extremity Assessment Lower Extremity Assessment: Defer to PT evaluation   Cervical / Trunk Assessment Cervical / Trunk Assessment: Normal   Communication Communication Communication: No difficulties   Cognition Arousal/Alertness: Awake/alert Behavior During Therapy: WFL for tasks assessed/performed Overall Cognitive Status: Within Functional Limits for tasks assessed                                     General Comments       Exercises Other Exercises Other Exercises: pt educated in polar care mgt and compression stocking mgt   Shoulder Instructions      Home Living Family/patient expects to be discharged to:: Private residence Living Arrangements: Spouse/significant other Available Help at Discharge: Family Type of Home: House Home Access: Stairs to enter Technical brewer of Steps: 3 Entrance Stairs-Rails: None Home Layout: One level     Bathroom Shower/Tub: Tub/shower unit;Door   [employer] Toilet: Standard     Home Equipment: Cane - single point;Hand held shower head;Adaptive equipment Adaptive Equipment: Reacher        Prior Functioning/Environment Level of Independence: Independent with assistive device(s)        Comments: Mod indep for ADLs, household and community mobilization with [facility]; denies fall history. Increased difficulty lately with toilet and tub transfers due to bilat knee pain        OT Problem List:  Decreased strength;Decreased knowledge of use of DME or AE;Decreased activity tolerance;Pain      OT Treatment/Interventions: Self-care/ADL training;Therapeutic exercise;Therapeutic activities;Energy conservation;Patient/family education;DME and/or AE instruction    OT Goals(Current goals can be found in the care plan section) Acute Rehab OT Goals Patient Stated Goal: go home and get stronger OT Goal Formulation: With patient Time For Goal Achievement: 02/07/17 Potential to Achieve Goals: Good  OT Frequency: Min 2X/week   Barriers to D/C:            Co-evaluation              AM-PAC PT "6 Clicks" Daily Activity     Outcome Measure Help from another person eating meals?: None Help from another person taking care of personal grooming?: None Help from another person toileting, which includes using toliet, bedpan, or urinal?: A Little Help from another person bathing (including washing, rinsing, drying)?: A Little Help from another person to put on and taking off regular upper body clothing?: None Help from another person to put on and taking off regular lower body clothing?: A Little 6 Click Score: 21   End of Session    Activity Tolerance: Patient tolerated treatment well Patient left: in bed;with call bell/phone within reach;with bed alarm set;with family/visitor present;with SCD's reapplied;Other (comment)(polar care in place)  OT Visit Diagnosis:  Other abnormalities of gait and mobility (R26.89);Pain Pain - Right/Left: Left Pain - part of body: Knee                Time: 2334-3568 OT Time Calculation (min): 35 min Charges:  OT General Charges $OT Visit: 1 Visit OT Evaluation $OT Eval Low Complexity: 1 Low OT Treatments $Self Care/Home Management : 23-37 mins G-Codes: OT G-codes **NOT FOR INPATIENT CLASS** Functional Assessment Tool Used: AM-PAC 6 Clicks Daily Activity;Clinical judgement Functional Limitation: Self care Self Care Current Status (S1683): At least  20 percent but less than 40 percent impaired, limited or restricted Self Care Goal Status (F2902): At least 1 percent but less than 20 percent impaired, limited or restricted   Jeni Salles, MPH, MS, OTR/L ascom [phone] 01/24/17, 9:40 AM

## 2017-01-24 NOTE — Care Management Note (Signed)
Case Management Note  Patient Details  Name: LEMARCUS [name] MRN: [id number] Date of Birth: [birth year]  Subjective/Objective:  Spoke with [name] at [facility]. She states patient PCP is [name], MD. Case Manager is [name] Ext. [phone]. [phone] [facility] 1)  Home Health coordinator is [name] [phone].  Spoke with [name] who referred me to Care in the [facility] [phone]. [facility] and they referred me to social work [phone] ext [phone]. LM for return call as this recording said this was for homeless veterans. Also LM for [name] at [name] for return call for additional assistance [phone] ext [phone]                     Action/Plan: Waiting on return call  Expected Discharge Date:  01/25/17               Expected Discharge Plan:  [place]  In-House Referral:     Discharge planning Services  CM Consult  Post Acute Care Choice:  Durable Medical Equipment, Home Health Choice offered to:  Patient  DME Arranged:  Walker rolling DME Agency:  [place] Arranged:  PT, OT [facility] Agency:     Status of Service:  In process, will continue to follow  If discussed at Long Length of Stay Meetings, dates discussed:    Additional Comments:  [name], RN 01/24/2017, 10:03 AM

## 2017-01-24 NOTE — Progress Notes (Signed)
   Subjective: 1 Day Post-Op Procedure(s) (LRB): TOTAL KNEE ARTHROPLASTY (Left) Patient reports pain as moderate.   Patient is well, and has had no acute complaints or problems Denies any CP, SOB, ABD pain. We will continue therapy today.    Objective: Vital signs in last 24 hours: Temp:  [97.4 F (36.3 C)-98 F (36.7 C)] 97.9 F (36.6 C) (12/19 0805) Pulse Rate:  [44-90] 68 (12/19 0805) Resp:  [10-20] 18 (12/19 0805) BP: (111-154)/(46-71) 130/46 (12/19 0805) SpO2:  [95 %-100 %] 98 % (12/19 0805) FiO2 (%):  [21 %] 21 % (12/18 1050)  Intake/Output from previous day: 12/18 0701 - 12/19 0700 In: 4385 [P.O.:1580; I.V.:2625; IV Piggyback:180] Out: 5000 [Urine:4900; Blood:100] Intake/Output this shift: No intake/output data recorded.  Recent Labs    01/23/17 1118 01/24/17 0252  HGB 12.0* 10.5*   Recent Labs    01/23/17 1118 01/24/17 0252  WBC 9.2 9.9  RBC 3.83* 3.32*  HCT 35.9* 30.5*  PLT 181 187   Recent Labs    01/23/17 1118 01/24/17 0252  NA  --  136  K  --  4.0  CL  --  105  CO2  --  25  BUN  --  12  CREATININE 0.74 0.75  GLUCOSE  --  167*  CALCIUM  --  8.5*   No results for input(s): LABPT, INR in the last 72 hours.  EXAM General - Patient is Alert, Appropriate and Oriented Extremity - Neurovascular intact Sensation intact distally Intact pulses distally Dorsiflexion/Plantar flexion intact No cellulitis present Compartment soft Dressing - dressing C/D/I and no drainage Motor Function - intact, moving foot and toes well on exam.   Past Medical History:  Diagnosis Date  . Anemia   . Arthritis    Osteoarthritis  . Coronary artery disease   . Full dentures    upper and lower  . GERD (gastroesophageal reflux disease)   . Hyperlipidemia   . Hypertension     Assessment/Plan:   1 Day Post-Op Procedure(s) (LRB): TOTAL KNEE ARTHROPLASTY (Left) Active Problems:   Primary osteoarthritis of left knee  Estimated body mass index is 19.49 kg/m  as calculated from the following:   Height as of this encounter: 5\' 9"  (1.753 m).   Weight as of this encounter: 59.9 kg (132 lb). Advance diet Up with therapy  Needs BM Acute post op blood loss anemia with underlying chronic anemia of unknown etiology - Hgb 10.5. Recheck labs in the am. Continue with Iron CM to assist with discharge  DVT Prophylaxis - Lovenox, Foot Pumps and TED hose Weight-Bearing as tolerated to left leg   T. Rachelle Hora, PA-C [place] 01/24/2017, 8:06 AM

## 2017-01-25 DIAGNOSIS — E43 Unspecified severe protein-calorie malnutrition: Secondary | ICD-10-CM

## 2017-01-25 LAB — BASIC METABOLIC PANEL
ANION GAP: 5 (ref 5–15)
BUN: 15 mg/dL (ref 6–20)
CHLORIDE: 105 mmol/L (ref 101–111)
CO2: 25 mmol/L (ref 22–32)
Calcium: 8.6 mg/dL — ABNORMAL LOW (ref 8.9–10.3)
Creatinine, Ser: 0.78 mg/dL (ref 0.61–1.24)
GFR calc non Af Amer: >60 mL/min (ref >60)
Glucose, Bld: 124 mg/dL — ABNORMAL HIGH (ref 65–99)
Potassium: 4.1 mmol/L (ref 3.5–5.1)
SODIUM: 135 mmol/L (ref 135–145)

## 2017-01-25 LAB — CBC
HEMATOCRIT: 29.8 % — AB (ref 40.0–52.0)
HEMOGLOBIN: 10.2 g/dL — AB (ref 13.0–18.0)
MCH: 32.2 pg (ref 26.0–34.0)
MCHC: 34.4 g/dL (ref 32.0–36.0)
MCV: 93.7 fL (ref 80.0–100.0)
Platelets: 151 10*3/uL (ref 150–440)
RBC: 3.18 MIL/uL — AB (ref 4.40–5.90)
RDW: 13 % (ref 11.5–14.5)
WBC: 7.8 10*3/uL (ref 3.8–10.6)

## 2017-01-25 MED ORDER — ASPIRIN EC 325 MG PO TBEC: 325.0000 mg | DELAYED_RELEASE_TABLET | Freq: Two times a day (BID) | ORAL | 0 refills | Status: DC

## 2017-01-25 MED ORDER — OXYCODONE HCL 5 MG PO TABS: 5.0000 mg | ORAL_TABLET | ORAL | 0 refills | Status: DC | PRN

## 2017-01-25 NOTE — Care Management (Addendum)
Spoke with Malachy Mood at [place] for assistance in getting authorization for home health. She is making some calls and will call this RNCM back with more information  Informed patient that he will have to get his Lovenox at the [facility] within the next 24 hours.   Spoke with Rachelle Hora, PA and he will bring the prescriptions for Lovenox and pain medications to me today to give to patient.

## 2017-01-25 NOTE — Care Management (Addendum)
Patient to be discharged home today following a BM. Patient will dc on ASA and oxycodone. Patient states he can afford the oxycodone at his local pharmacy. [facility] notified and will do his PT and OT starting tomorrow.

## 2017-01-25 NOTE — Care Management (Signed)
Donald [name] with [employer] received authorization for home health services. They will provide home PT and OT. Pharmacy is now the obstacle in that ortho MD does not have prescription writing privileges at the [place]. [facility] am [facility] Rudene Christians office now regarding getting paperwork  Needed for him to have privileges. Patient updated and is in agreement with POC.

## 2017-01-25 NOTE — Progress Notes (Signed)
Patient given Mag Citrate to facilitate bowel movement. No results at this time. Patient refuses suppository.

## 2017-01-25 NOTE — Progress Notes (Addendum)
   Subjective: 2 Days Post-Op Procedure(s) (LRB): TOTAL KNEE ARTHROPLASTY (Left) Patient reports pain as mild.   Patient is well, and has had no acute complaints or problems Denies any CP, SOB, ABD pain. We will continue therapy today.    Objective: Vital signs in last 24 hours: Temp:  [97.9 F (36.6 C)-99.1 F (37.3 C)] 98.5 F (36.9 C) (12/20 0414) Pulse Rate:  [68-89] 80 (12/20 0414) Resp:  [17-20] 18 (12/20 0414) BP: (130-155)/(46-77) 140/62 (12/20 0414) SpO2:  [97 %-100 %] 99 % (12/20 0414)  Intake/Output from previous day: 12/19 0701 - 12/20 0700 In: 1560 [P.O.:1560] Out: 2065 [Urine:2065] Intake/Output this shift: No intake/output data recorded.  Recent Labs    01/23/17 1118 01/24/17 0252 01/25/17 0425  HGB 12.0* 10.5* 10.2*   Recent Labs    01/24/17 0252 01/25/17 0425  WBC 9.9 7.8  RBC 3.32* 3.18*  HCT 30.5* 29.8*  PLT 187 151   Recent Labs    01/24/17 0252 01/25/17 0425  NA 136 135  K 4.0 4.1  CL 105 105  CO2 25 25  BUN 12 15  CREATININE 0.75 0.78  GLUCOSE 167* 124*  CALCIUM 8.5* 8.6*   No results for input(s): LABPT, INR in the last 72 hours.  EXAM General - Patient is Alert, Appropriate and Oriented Extremity - Neurovascular intact Sensation intact distally Intact pulses distally Dorsiflexion/Plantar flexion intact No cellulitis present Compartment soft Dressing - dressing C/D/I and no drainage, dressing changed, honeycomb applied Motor Function - intact, moving foot and toes well on exam.   Past Medical History:  Diagnosis Date  . Anemia   . Arthritis    Osteoarthritis  . Coronary artery disease   . Full dentures    upper and lower  . GERD (gastroesophageal reflux disease)   . Hyperlipidemia   . Hypertension     Assessment/Plan:   2 Days Post-Op Procedure(s) (LRB): TOTAL KNEE ARTHROPLASTY (Left) Active Problems:   Primary osteoarthritis of left knee   Protein-calorie malnutrition, severe  Estimated body mass index  is 19.49 kg/m as calculated from the following:   Height as of this encounter: 5\' 9"  (1.753 m).   Weight as of this encounter: 59.9 kg (132 lb). Advance diet Up with therapy  Needs BM Acute post op blood loss anemia with underlying chronic anemia of unknown etiology - Hgb 10.2. Stable. Continue with Iron CM to assist with discharge, home with HHPT today pending BM and completion of PT goals  DVT Prophylaxis - Lovenox, Foot Pumps and TED hose Weight-Bearing as tolerated to left leg   T. Rachelle Hora, PA-C [place] 01/25/2017, 8:02 AM

## 2017-01-25 NOTE — Progress Notes (Signed)
Physical Therapy Treatment Patient Details Name: Donald [name] MRN: [id number] DOB: September 03, 1945 Today's Date: 01/25/2017    History of Present Illness admitted for acute hospitalization status post L TKR (01/23/17), WBAT.    PT Comments    Pt continues to demonstrate progress with functional mobility. PM session pt was able to ambulate 120ft with RW and demonstrated progression from step to pattern to step through with improving heel strike and knee flexion with step through. Pt demonstrated improving balance while voiding at toilet with single UE support and no LOB. Pt anticipates to be discharged later this afternoon with home health services.    Follow Up Recommendations  Home health PT     Equipment Recommendations  Rolling walker with 5" wheels    Recommendations for Other Services       Precautions / Restrictions Precautions Precautions: Fall Restrictions Weight Bearing Restrictions: Yes LLE Weight Bearing: Weight bearing as tolerated    Mobility  Bed Mobility Overal bed mobility: Modified Independent             General bed mobility comments: increased time and use of features  Transfers Overall transfer level: Needs assistance Equipment used: Rolling walker (2 wheeled) Transfers: Sit to/from Stand Sit to Stand: Min guard         General transfer comment: increased time and use of arm rests and bed rail  Ambulation/Gait Ambulation/Gait assistance: Min guard Ambulation Distance (Feet): 160 Feet Assistive device: Rolling walker (2 wheeled) Gait Pattern/deviations: Step-to pattern;Step-through pattern   Gait velocity interpretation: <1.8 ft/sec, indicative of risk for recurrent falls General Gait Details: Initially step to patter progressing to step through    [employer] Mobility    Modified Rankin (Stroke Patients Only)       Balance Overall balance assessment: Needs assistance Sitting-balance support: No upper  extremity supported Sitting balance-Leahy Scale: Good     Standing balance support: Bilateral upper extremity supported Standing balance-Leahy Scale: Fair                              Cognition Arousal/Alertness: Awake/alert Behavior During Therapy: WFL for tasks assessed/performed Overall Cognitive Status: Within Functional Limits for tasks assessed                                        Exercises Total Joint Exercises Ankle Circles/Pumps: AROM;Both;10 reps;Supine Quad Sets: Strengthening;Both;10 reps;Supine Short Arc Quad: AROM;Strengthening;Left;10 reps;Supine Heel Slides: AAROM;Strengthening;Left;10 reps;Supine Hip ABduction/ADduction: AROM;Strengthening;Left;10 reps;Supine Straight Leg Raises: AAROM;Strengthening;Left;10 reps;Supine Long Arc Quad: AROM;Strengthening;Left;10 reps;Seated Knee Flexion: AROM;Left;5 reps;Seated Goniometric ROM: 5-80    General Comments        Pertinent Vitals/Pain Pain Assessment: 0-10 Pain Score: 4  Pain Location: L knee Pain Descriptors / Indicators: Guarding;Operative site guarding Pain Intervention(s): Limited activity within patient's tolerance;Monitored during session    Home Living                      Prior Function            PT Goals (current goals can now be found in the care plan section) Acute Rehab PT Goals Patient Stated Goal: go home and get stronger Progress towards PT goals: Progressing toward goals    Frequency    BID      PT  Plan Current plan remains appropriate    Co-evaluation              AM-PAC PT "6 Clicks" Daily Activity  Outcome Measure  Difficulty turning over in bed (including adjusting bedclothes, sheets and blankets)?: A Little Difficulty moving from lying on back to sitting on the side of the bed? : A Little Difficulty sitting down on and standing up from a chair with arms (e.g., wheelchair, bedside commode, etc,.)?: Unable Help needed moving  to and from a bed to chair (including a wheelchair)?: A Little Help needed walking in hospital room?: A Little Help needed climbing 3-5 steps with a railing? : A Little 6 Click Score: 16    End of Session Equipment Utilized During Treatment: Gait belt Activity Tolerance: Patient tolerated treatment well Patient left: in chair;with call bell/phone within reach;with chair alarm set   PT Visit Diagnosis: Muscle weakness (generalized) (M62.81);Difficulty in walking, not elsewhere classified (R26.2);Pain Pain - Right/Left: Left Pain - part of body: Knee     Time: 1340-1404 PT Time Calculation (min) (ACUTE ONLY): 24 min  Charges:  $Gait Training: 8-22 mins $Therapeutic Exercise: 8-22 mins $Therapeutic Activity: 8-22 mins                    G Codes:          Gwendelyn Lanting  Maymie Brunke, PTA 01/25/2017, 4:00 PM

## 2017-01-25 NOTE — Progress Notes (Signed)
OT Cancellation Note  Patient Details Name: Donald [name] MRN: [id number] DOB: October 16, 1945   Cancelled Treatment:    Reason Eval/Treat Not Completed: Patient declined, no reason specified. Pt. Declined further A/E training. Pt. Requested to stand at the sink to complete hygiene tasks, however once at the sink, he refused any assistance. Pt. Requested to perform grooming, denture care, personal care and bathing alone while standing at the sink. Nursing was notified. Pt. Was educated that we could not leave him alone, unattended in standing. Therapist Offered to provide set-up for pt. To complete personal care sitting, or at bed level. Pt. Refused reporting he would wait until his wife arrived. Pt. Was assisted back to bed.   Harrel Carina, MS, OTR/L 01/25/2017, 12:23 PM

## 2017-01-25 NOTE — Discharge Instructions (Signed)

## 2017-01-25 NOTE — Progress Notes (Signed)
Per Gerald Stabs, PA-C patient ok to discharge without BM. Patient reports similar scenario last admission for hip surgery. Patient educated to take Colace BID and report no BM by tomorrow afternoon to office. IV removed and discharge instructions reviewed. Patient aware to stop ASA 81mg  and begin ASA EC 325mg . Follow up appointment highlighted for patient and reviewed. [facility] information given to patient for admission to [facility] tomorrow for OT and PT needs. Polar care and bone foam supplied.

## 2017-01-25 NOTE — Progress Notes (Signed)
Physical Therapy Treatment Patient Details Name: Donald [name] MRN: [id number] DOB: 1945-06-23 Today's Date: 01/25/2017    History of Present Illness admitted for acute hospitalization status post L TKR (01/23/17), WBAT.    PT Comments    Pt presented in bed agreeable to therapy. Pt indicating decreased pain since bandages taken off. Pt able to perform supine therex as noted below. Pt now able to complete SLR initially AA fading to AROM. Pt instructed in continuing knee ROM and maintaining extension in bed. ROM slowly improving. Gait deferred this session as handed off to OT. He would continue to benefit from skilled PT to improve, strength, ROM, and balance to increase safety with functional mobility.   Follow Up Recommendations  Home health PT     Equipment Recommendations  Rolling walker with 5" wheels    Recommendations for Other Services       Precautions / Restrictions Precautions Precautions: Fall Restrictions Weight Bearing Restrictions: Yes LLE Weight Bearing: Weight bearing as tolerated    Mobility  Bed Mobility Overal bed mobility: Modified Independent             General bed mobility comments: increased time and use of features  Transfers                    Ambulation/Gait                 Stairs            Wheelchair Mobility    Modified Rankin (Stroke Patients Only)       Balance Overall balance assessment: Needs assistance Sitting-balance support: No upper extremity supported Sitting balance-Leahy Scale: Good                                      Cognition Arousal/Alertness: Awake/alert Behavior During Therapy: WFL for tasks assessed/performed Overall Cognitive Status: Within Functional Limits for tasks assessed                                        Exercises Total Joint Exercises Ankle Circles/Pumps: AROM;Both;10 reps;Supine Quad Sets: Strengthening;Both;10 reps;Supine Short  Arc Quad: AROM;Strengthening;Left;10 reps;Supine Heel Slides: AAROM;Strengthening;Left;10 reps;Supine Hip ABduction/ADduction: AROM;Strengthening;Left;10 reps;Supine Straight Leg Raises: AAROM;Strengthening;Left;10 reps;Supine Knee Flexion: AROM;Left;5 reps;Seated Goniometric ROM: 5-80    General Comments        Pertinent Vitals/Pain Pain Assessment: 0-10 Pain Score: 5  Pain Location: L knee Pain Descriptors / Indicators: Guarding;Operative site guarding Pain Intervention(s): Limited activity within patient's tolerance;Monitored during session    Home Living                      Prior Function            PT Goals (current goals can now be found in the care plan section) Acute Rehab PT Goals Patient Stated Goal: go home and get stronger Progress towards PT goals: Progressing toward goals    Frequency    BID      PT Plan Current plan remains appropriate    Co-evaluation              AM-PAC PT "6 Clicks" Daily Activity  Outcome Measure  Difficulty turning over in bed (including adjusting bedclothes, sheets and blankets)?: A Little Difficulty moving from lying on back to sitting on  the side of the bed? : A Little Difficulty sitting down on and standing up from a chair with arms (e.g., wheelchair, bedside commode, etc,.)?: Unable Help needed moving to and from a bed to chair (including a wheelchair)?: A Little Help needed walking in hospital room?: A Little Help needed climbing 3-5 steps with a railing? : A Little 6 Click Score: 16    End of Session   Activity Tolerance: Patient tolerated treatment well Patient left: Other (comment)(EOB with hand off to OT)   PT Visit Diagnosis: Muscle weakness (generalized) (M62.81);Difficulty in walking, not elsewhere classified (R26.2);Pain Pain - Right/Left: Left Pain - part of body: Knee     Time: 0165-5374 PT Time Calculation (min) (ACUTE ONLY): 23 min  Charges:  $Therapeutic Exercise: 8-22 mins              Laurene Melendrez  Torrin Crihfield, PTA 01/25/2017, 3:33 PM

## 2017-01-25 NOTE — Progress Notes (Signed)
OT Cancellation Note  Patient Details Name: BLAZE NYLUND MRN: [id number] DOB: [birth year]   Cancelled Treatment:    Reason Eval/Treat Not Completed: Patient at procedure or test/ unavailable(Attempted to see pt. Pt. was working with PT. Will reattempt at a later time/date.)  Harrel Carina, MS, OTR/L 01/25/2017, 10:48 AM

## 2017-01-25 NOTE — Discharge Summary (Signed)
Physician Discharge Summary  Patient ID: Donald [name] MRN: [id number] DOB/AGE: 72-25-1947 72 y.o.  Admit date: 01/23/2017 Discharge date: 01/25/2017 Admission Diagnoses:  PRIMARY OSTEOARTHRITIS OF LEFT KNEE   Discharge Diagnoses: Patient Active Problem List   Diagnosis Date Noted  . Protein-calorie malnutrition, severe 01/25/2017  . Primary osteoarthritis of left knee 01/23/2017  . Hypertension 07/14/2014  . GERD (gastroesophageal reflux disease) 07/14/2014  . Hyperlipidemia 07/14/2014  . Osteoarthritis 07/14/2014  . Gastric ulcer 07/14/2014  . Abdominal pain, epigastric 07/14/2014    Past Medical History:  Diagnosis Date  . Anemia   . Arthritis    Osteoarthritis  . Coronary artery disease   . Full dentures    upper and lower  . GERD (gastroesophageal reflux disease)   . Hyperlipidemia   . Hypertension      Transfusion: none   Consultants (if any):   Discharged Condition: Improved  Hospital Course: Donald [name] is an 71 y.o. male who was admitted 01/23/2017 with a diagnosis of left knee osteoarthritis and went to the operating room on 01/23/2017 and underwent the above named procedures.    Surgeries: Procedure(s): TOTAL KNEE ARTHROPLASTY on 01/23/2017 Patient tolerated the surgery well. Taken to PACU where she was stabilized and then transferred to the orthopedic floor.  Started on Lovenox 30 mg q 12 hrs. Foot pumps applied bilaterally at 80 mm. Heels elevated on bed with rolled towels. No evidence of DVT. Negative Homan. Physical therapy started on day #1 for gait training and transfer. OT started day #1 for ADL and assisted devices.  Patient's foley was d/c on day #1. Patient's IV was d/c on day #2.  On post op day #3 patient was stable and ready for discharge to home with HHPT.  Implants: Medacta GMK sphere 3 femur, 3 tibia with short stem 10 mm, 3 patella all components cemented    He was given perioperative antibiotics:  Anti-infectives (From  admission, onward)   Start     Dose/Rate Route Frequency Ordered Stop   01/23/17 1400  ceFAZolin (ANCEF) 1 g in dextrose 5 % 50 mL IVPB     1 g 120 mL/hr over 30 Minutes Intravenous Every 6 hours 01/23/17 1139 01/24/17 0212   01/23/17 1100  ceFAZolin (ANCEF) IVPB 1 g/50 mL premix  Status:  Discontinued     1 g 100 mL/hr over 30 Minutes Intravenous Every 6 hours 01/23/17 1050 01/23/17 1139   01/23/17 0606  ceFAZolin (ANCEF) 2-4 GM/100ML-% IVPB    Comments:  Lyman Bishop   : cabinet override      01/23/17 0606 01/23/17 0804   01/22/17 2215  ceFAZolin (ANCEF) IVPB 2g/100 mL premix     2 g 200 mL/hr over 30 Minutes Intravenous  Once 01/22/17 2209 01/23/17 0834    .  He was given sequential compression devices, early ambulation, and Aspirin 325 mg BID for DVT prophylaxis.  He benefited maximally from the hospital stay and there were no complications.    Recent vital signs:  Vitals:   01/25/17 0414 01/25/17 0900  BP: 140/62 136/65  Pulse: 80 78  Resp: 18 18  Temp: 98.5 F (36.9 C) 98.9 F (37.2 C)  SpO2: 99% 100%    Recent laboratory studies:  Lab Results  Component Value Date   HGB 10.2 (L) 01/25/2017   HGB 10.5 (L) 01/24/2017   HGB 12.0 (L) 01/23/2017   Lab Results  Component Value Date   WBC 7.8 01/25/2017   PLT 151 01/25/2017  Lab Results  Component Value Date   INR 0.98 01/10/2017   Lab Results  Component Value Date   NA 135 01/25/2017   K 4.1 01/25/2017   CL 105 01/25/2017   CO2 25 01/25/2017   BUN 15 01/25/2017   CREATININE 0.78 01/25/2017   GLUCOSE 124 (H) 01/25/2017    Discharge Medications:   Allergies as of 01/25/2017      Reactions   Ciprofloxacin Nausea Only   Flagyl [metronidazole] Nausea Only      Medication List    STOP taking these medications   aspirin 81 MG tablet Replaced by:  aspirin EC 325 MG tablet   HYDROcodone-acetaminophen 5-325 MG tablet Commonly known as:  NORCO/VICODIN     TAKE these medications   alfuzosin 10  MG 24 hr tablet Commonly known as:  UROXATRAL Take 10 mg by mouth at bedtime.   amLODipine 10 MG tablet Commonly known as:  NORVASC Take 5 mg by mouth daily.   aspirin EC 325 MG tablet Take 1 tablet (325 mg total) by mouth 2 (two) times daily. Replaces:  aspirin 81 MG tablet   atorvastatin 80 MG tablet Commonly known as:  LIPITOR Take 80 mg by mouth daily.   carvedilol 3.125 MG tablet Commonly known as:  COREG Take 3.125 mg by mouth 2 (two) times daily with a meal.   clonazePAM 1 MG tablet Commonly known as:  KLONOPIN Take 1 mg by mouth at bedtime.   diclofenac 75 MG EC tablet Commonly known as:  VOLTAREN Take 75 mg by mouth 2 (two) times daily.   docusate sodium 50 MG capsule Commonly known as:  COLACE Take 1 capsule (50 mg total) by mouth 2 (two) times daily. What changed:    when to take this  reasons to take this   ferrous sulfate 325 (65 FE) MG tablet Take 1 tablet (325 mg total) by mouth daily with breakfast.   Fish Oil 1200 MG Cpdr Take 1,200 mg by mouth daily.   lisinopril 20 MG tablet Commonly known as:  PRINIVIL,ZESTRIL Take 10 mg by mouth daily.   oxyCODONE 5 MG immediate release tablet Commonly known as:  Oxy IR/ROXICODONE Take 1-2 tablets (5-10 mg total) by mouth every 4 (four) hours as needed for moderate pain ((score 4 to 6)).   pantoprazole 40 MG tablet Commonly known as:  PROTONIX Take 40 mg by mouth 2 (two) times daily.   vitamin C 500 MG tablet Commonly known as:  ASCORBIC ACID Take 500 mg by mouth daily.            Durable Medical Equipment  (From admission, onward)        Start     Ordered   01/23/17 1050  DME Walker rolling  Once    Question:  Patient needs a walker to treat with the following condition  Answer:  Status post total left knee replacement using cement   01/23/17 1050   01/23/17 1050  DME 3 n 1  Once     01/23/17 1050   01/23/17 1050  DME Bedside commode  Once    Question:  Patient needs a bedside commode  to treat with the following condition  Answer:  Status post total left knee replacement using cement   01/23/17 1050      Diagnostic Studies: Dg Knee 1-2 Views Left  Result Date: 01/23/2017 CLINICAL DATA:  Status post left total knee joint prosthesis placement. EXAM: LEFT KNEE - 1-2 VIEW COMPARISON:  CT scan  of the knee of November 03, 2016 FINDINGS: The patient has undergone total left knee joint prosthesis placement. Radiographic positioning of the prosthetic components is good. The interface of the prosthetic components with the native bone appears normal. There is air in the anterior aspect of the joint space. IMPRESSION: No immediate postprocedure complication following left total knee joint prosthesis placement. Electronically Signed   By: David  Martinique M.D.   On: 01/23/2017 10:23    Disposition: 01-Home or Self Care    Follow-up Information    Hessie Knows, MD Follow up in 2 week(s).   Specialty:  Orthopedic Surgery Contact information: [address] [place] [address] [phone]            Signed: Dorise Hiss [facility] 01/25/2017, 11:28 AM

## 2017-01-26 DIAGNOSIS — M25562 Pain in left knee: Secondary | ICD-10-CM | POA: Diagnosis not present

## 2017-01-26 DIAGNOSIS — Z96652 Presence of left artificial knee joint: Secondary | ICD-10-CM | POA: Diagnosis not present

## 2017-01-26 DIAGNOSIS — D649 Anemia, unspecified: Secondary | ICD-10-CM | POA: Diagnosis not present

## 2017-01-26 DIAGNOSIS — Z471 Aftercare following joint replacement surgery: Secondary | ICD-10-CM | POA: Diagnosis not present

## 2017-01-26 DIAGNOSIS — M1711 Unilateral primary osteoarthritis, right knee: Secondary | ICD-10-CM | POA: Diagnosis not present

## 2017-01-26 DIAGNOSIS — E43 Unspecified severe protein-calorie malnutrition: Secondary | ICD-10-CM | POA: Diagnosis not present

## 2017-01-26 DIAGNOSIS — I2510 Atherosclerotic heart disease of native coronary artery without angina pectoris: Secondary | ICD-10-CM | POA: Diagnosis not present

## 2017-01-26 DIAGNOSIS — I10 Essential (primary) hypertension: Secondary | ICD-10-CM | POA: Diagnosis not present

## 2017-01-26 NOTE — Care Management (Signed)
Ordered bsc and walker for home delivery today. Ordered from [facility].

## 2017-01-28 ENCOUNTER — Other Ambulatory Visit: Payer: Self-pay

## 2017-01-28 ENCOUNTER — Encounter: Payer: Self-pay | Admitting: Emergency Medicine

## 2017-01-28 ENCOUNTER — Emergency Department: Payer: Medicare Other

## 2017-01-28 ENCOUNTER — Emergency Department
Admission: EM | Admit: 2017-01-28 | Discharge: 2017-01-28 | Disposition: A | Discharge status: Home / Self Care | Payer: Medicare Other | Attending: Emergency Medicine | Admitting: Emergency Medicine

## 2017-01-28 DIAGNOSIS — Z79899 Other long term (current) drug therapy: Secondary | ICD-10-CM | POA: Insufficient documentation

## 2017-01-28 DIAGNOSIS — Z791 Long term (current) use of non-steroidal anti-inflammatories (NSAID): Secondary | ICD-10-CM | POA: Insufficient documentation

## 2017-01-28 DIAGNOSIS — I2510 Atherosclerotic heart disease of native coronary artery without angina pectoris: Secondary | ICD-10-CM | POA: Insufficient documentation

## 2017-01-28 DIAGNOSIS — M79605 Pain in left leg: Secondary | ICD-10-CM | POA: Insufficient documentation

## 2017-01-28 DIAGNOSIS — Z87891 Personal history of nicotine dependence: Secondary | ICD-10-CM | POA: Insufficient documentation

## 2017-01-28 DIAGNOSIS — Z7982 Long term (current) use of aspirin: Secondary | ICD-10-CM | POA: Insufficient documentation

## 2017-01-28 DIAGNOSIS — I10 Essential (primary) hypertension: Secondary | ICD-10-CM | POA: Diagnosis not present

## 2017-01-28 DIAGNOSIS — M7989 Other specified soft tissue disorders: Secondary | ICD-10-CM | POA: Diagnosis not present

## 2017-01-28 NOTE — ED Triage Notes (Signed)
Pt had knee replacement on Tuesday this past week and went home Wednesday.  [facility] did not get lovenox approved so ASA was incraesed. Started Friday with left calf swelling. Told to come for r/o DVT. Appears to have mild swelling to LLE

## 2017-01-28 NOTE — ED Notes (Signed)
Pt taken to car via wheelchair and helped into car by family and this RN. Pt in NAD and verbalized understnading ov follow up and home care.

## 2017-01-28 NOTE — ED Notes (Signed)
First nurse note  Presents with family   Having left lower leg pain s/p knee replacement on 12/18

## 2017-01-28 NOTE — Discharge Instructions (Signed)
Please seek medical attention for any high fevers, chest pain, shortness of breath, change in behavior, persistent vomiting, bloody stool or any other new or concerning symptoms.  

## 2017-01-28 NOTE — ED Notes (Signed)
MD at bedside with patient and patient's family. 

## 2017-01-28 NOTE — ED Provider Notes (Signed)
[facility] Emergency Department Provider Note   ____________________________________________   I have reviewed the triage vital signs and the nursing notes.   HISTORY  Chief Complaint Leg Swelling   History limited by: Not Limited   HPI Donald [name] is a 71 y.o. male who presents to the emergency department today because of concern for left leg pain and swelling.   LOCATION:left lower leg DURATION:1 day TIMING: constant SEVERITY: severe QUALITY: swelling, pain CONTEXT: patient recently underwent left knee surgery. States today was the first day when he started having pain in his left lower leg and calf. States that it was hard for him to walk with the pain. MODIFYING FACTORS: worse with walking ASSOCIATED SYMPTOMS: denies any chest pain or shortness of breath. Has had some bruising around the ankle.   Per medical record review patient has a history of recent left knee surgery  Past Medical History:  Diagnosis Date  . Anemia   . Arthritis    Osteoarthritis  . Coronary artery disease   . Full dentures    upper and lower  . GERD (gastroesophageal reflux disease)   . Hyperlipidemia   . Hypertension     Patient Active Problem List   Diagnosis Date Noted  . Protein-calorie malnutrition, severe 01/25/2017  . Primary osteoarthritis of left knee 01/23/2017  . Hypertension 07/14/2014  . GERD (gastroesophageal reflux disease) 07/14/2014  . Hyperlipidemia 07/14/2014  . Osteoarthritis 07/14/2014  . Gastric ulcer 07/14/2014  . Abdominal pain, epigastric 07/14/2014    Past Surgical History:  Procedure Laterality Date  . APPENDECTOMY  1979  . BACK SURGERY  [phone]  . CHOLECYSTECTOMY  2013  . COLONOSCOPY N/A 06/25/2014   Procedure: COLONOSCOPY;  Surgeon: Lucilla Lame, MD;  Location: [facility];  Service: Gastroenterology;  Laterality: N/A;  cecum time- 0906  . CORONARY ANGIOPLASTY WITH STENT PLACEMENT  2006  .  ESOPHAGOGASTRODUODENOSCOPY N/A 06/25/2014   Procedure: ESOPHAGOGASTRODUODENOSCOPY (EGD);  Surgeon: Lucilla Lame, MD;  Location: [place] [facility];  Service: Gastroenterology;  Laterality: N/A;  . FLEXIBLE SIGMOIDOSCOPY  12/13/2010  . FRACTURE SURGERY Left    femur  . HERNIA REPAIR    . ORIF FEMUR FRACTURE  2012  . ROTATOR CUFF REPAIR Right   . TONSILLECTOMY    . TOTAL KNEE ARTHROPLASTY Left 01/23/2017   Procedure: TOTAL KNEE ARTHROPLASTY;  Surgeon: Hessie Knows, MD;  Location: [facility];  Service: Orthopedics;  Laterality: Left;  . UMBILICAL HERNIA REPAIR      Prior to Admission medications   Medication Sig Start Date End Date Taking? Authorizing Provider  alfuzosin (UROXATRAL) 10 MG 24 hr tablet Take 10 mg by mouth at bedtime.    [provider]  amLODipine (NORVASC) 10 MG tablet Take 5 mg by mouth daily.     [provider]  aspirin EC 325 MG tablet Take 1 tablet (325 mg total) by mouth 2 (two) times daily. 01/25/17   Duanne Guess, PA-C  atorvastatin (LIPITOR) 80 MG tablet Take 80 mg by mouth daily.     [provider]  carvedilol (COREG) 3.125 MG tablet Take 3.125 mg by mouth 2 (two) times daily with a meal.    [provider]  clonazePAM (KLONOPIN) 1 MG tablet Take 1 mg by mouth at bedtime.    [provider]  diclofenac (VOLTAREN) 75 MG EC tablet Take 75 mg by mouth 2 (two) times daily.     [provider]  docusate sodium (COLACE) 50 MG capsule  Take 1 capsule (50 mg total) by mouth 2 (two) times daily. Patient taking differently: Take 50 mg by mouth 2 (two) times daily as needed for mild constipation.  06/21/16   Juline Patch, MD  ferrous sulfate 325 (65 FE) MG tablet Take 1 tablet (325 mg total) by mouth daily with breakfast. 06/26/16   Juline Patch, MD  lisinopril (PRINIVIL,ZESTRIL) 20 MG tablet Take 10 mg by mouth daily.     [provider]  Omega-3 Fatty Acids (FISH OIL) 1200 MG CPDR Take 1,200 mg by mouth  daily.     [provider]  oxyCODONE (OXY IR/ROXICODONE) 5 MG immediate release tablet Take 1-2 tablets (5-10 mg total) by mouth every 4 (four) hours as needed for moderate pain ((score 4 to 6)). 01/25/17   Duanne Guess, PA-C  pantoprazole (PROTONIX) 40 MG tablet Take 40 mg by mouth 2 (two) times daily.    [provider]  vitamin C (ASCORBIC ACID) 500 MG tablet Take 500 mg by mouth daily.     [provider]    Allergies Ciprofloxacin and Flagyl [metronidazole]  History reviewed. No pertinent family history.  Social History Social History   Tobacco Use  . Smoking status: Former Smoker    Last attempt to quit: 02/06/1993    Years since quitting: 23.9  . Smokeless tobacco: Never Used  Substance Use Topics  . Alcohol use: No  . Drug use: No    Review of Systems Constitutional: No fever/chills Eyes: No visual changes. ENT: No sore throat. Cardiovascular: Denies chest pain. Respiratory: Denies shortness of breath. Gastrointestinal: No abdominal pain.  No nausea, no vomiting.  No diarrhea.   Genitourinary: Negative for dysuria. Musculoskeletal: Positive for left lower leg pain and swelling. Skin: Positive for surgical incision site over the left knee Neurological: Negative for headaches, focal weakness or numbness.  ____________________________________________   PHYSICAL EXAM:  VITAL SIGNS: ED Triage Vitals  Enc Vitals Group     BP 01/28/17 1834 119/86     Pulse Rate 01/28/17 1834 78     Resp 01/28/17 1834 16     Temp 01/28/17 1834 98.2 F (36.8 C)     Temp Source 01/28/17 1834 Oral     SpO2 01/28/17 1834 97 %     Weight 01/28/17 1831 130 lb (59 kg)     Height 01/28/17 1831 5\' 8"  (1.727 m)     Head Circumference --      Peak Flow --      Pain Score 01/28/17 1831 10   Constitutional: Alert and oriented. Well appearing and in no distress. Eyes: Conjunctivae are normal.  ENT   Head: Normocephalic and atraumatic.   Nose: No  congestion/rhinnorhea.   Mouth/Throat: Mucous membranes are moist.   Neck: No stridor. Hematological/Lymphatic/Immunilogical: No cervical lymphadenopathy. Cardiovascular: Normal rate, regular rhythm.  No murmurs, rubs, or gallops.  Respiratory: Normal respiratory effort without tachypnea nor retractions. Breath sounds are clear and equal bilaterally. No wheezes/rales/rhonchi. Gastrointestinal: Soft and non tender. No rebound. No guarding.  Genitourinary: Deferred Musculoskeletal: Left calf with mild tenderness to palpation. No appreciable swelling to left lower leg.  Neurologic:  Normal speech and language. No gross focal neurologic deficits are appreciated.  Skin:  Surgical incision site over the knee is dry and intact. No surrounding erythema to knee or lower leg.  Psychiatric: Mood and affect are normal. Speech and behavior are normal. Patient exhibits appropriate insight and judgment.  ____________________________________________    LABS (pertinent positives/negatives)  None  ____________________________________________   EKG  None  ____________________________________________    RADIOLOGY  Left lower extremity US Negative for DVT  ____________________________________________   PROCEDURES  Procedures  ____________________________________________   INITIAL IMPRESSION / ASSESSMENT AND PLAN / ED COURSE  Pertinent labs & imaging results that were available during my care of the patient were reviewed by me and considered in my medical decision making (see chart for details).  Patient presented to the emergency department today because of concern for left lower leg swelling and pain in setting of recent surgery. ddx would include blood clot, infection amongst other etiology. [place] negative for clot. No erythema, warmth or fevers to suggest infection. Discussed conservative treatment with the patient. Discussed return precautions.    ____________________________________________   FINAL CLINICAL IMPRESSION(S) / ED DIAGNOSES  Final diagnoses:  Left leg pain     Note: This dictation was prepared with Dragon dictation. Any transcriptional errors that result from this process are unintentional     Nance Pear, MD 01/28/17 2136

## 2017-01-28 NOTE — ED Notes (Signed)
HR on pulse ox reading 28-32. RN palpated and got in 60s but pt was having pauses in pulse.  EKG done to verify rhythm; no complaints other than leg.  Dr Cinda Quest aware and EKG shown

## 2017-01-31 DIAGNOSIS — I10 Essential (primary) hypertension: Secondary | ICD-10-CM | POA: Diagnosis not present

## 2017-01-31 DIAGNOSIS — M1711 Unilateral primary osteoarthritis, right knee: Secondary | ICD-10-CM | POA: Diagnosis not present

## 2017-01-31 DIAGNOSIS — Z96652 Presence of left artificial knee joint: Secondary | ICD-10-CM | POA: Diagnosis not present

## 2017-01-31 DIAGNOSIS — I2510 Atherosclerotic heart disease of native coronary artery without angina pectoris: Secondary | ICD-10-CM | POA: Diagnosis not present

## 2017-01-31 DIAGNOSIS — Z471 Aftercare following joint replacement surgery: Secondary | ICD-10-CM | POA: Diagnosis not present

## 2017-01-31 DIAGNOSIS — M25562 Pain in left knee: Secondary | ICD-10-CM | POA: Diagnosis not present

## 2017-02-02 DIAGNOSIS — Z471 Aftercare following joint replacement surgery: Secondary | ICD-10-CM | POA: Diagnosis not present

## 2017-02-02 DIAGNOSIS — M1711 Unilateral primary osteoarthritis, right knee: Secondary | ICD-10-CM | POA: Diagnosis not present

## 2017-02-02 DIAGNOSIS — Z96652 Presence of left artificial knee joint: Secondary | ICD-10-CM | POA: Diagnosis not present

## 2017-02-02 DIAGNOSIS — I10 Essential (primary) hypertension: Secondary | ICD-10-CM | POA: Diagnosis not present

## 2017-02-02 DIAGNOSIS — I2510 Atherosclerotic heart disease of native coronary artery without angina pectoris: Secondary | ICD-10-CM | POA: Diagnosis not present

## 2017-02-02 DIAGNOSIS — M25562 Pain in left knee: Secondary | ICD-10-CM | POA: Diagnosis not present

## 2017-02-05 DIAGNOSIS — Z96652 Presence of left artificial knee joint: Secondary | ICD-10-CM | POA: Diagnosis not present

## 2017-02-05 DIAGNOSIS — I2510 Atherosclerotic heart disease of native coronary artery without angina pectoris: Secondary | ICD-10-CM | POA: Diagnosis not present

## 2017-02-05 DIAGNOSIS — I10 Essential (primary) hypertension: Secondary | ICD-10-CM | POA: Diagnosis not present

## 2017-02-05 DIAGNOSIS — M25562 Pain in left knee: Secondary | ICD-10-CM | POA: Diagnosis not present

## 2017-02-05 DIAGNOSIS — Z471 Aftercare following joint replacement surgery: Secondary | ICD-10-CM | POA: Diagnosis not present

## 2017-02-05 DIAGNOSIS — M1711 Unilateral primary osteoarthritis, right knee: Secondary | ICD-10-CM | POA: Diagnosis not present

## 2017-02-05 NOTE — Anesthesia Postprocedure Evaluation (Deleted)
Anesthesia Post Note  Patient: Donald [name]  Procedure(s) Performed: TOTAL KNEE ARTHROPLASTY (Left )  Anesthesia Type: General Comments: Patient discharged prior to anesthesia evaluation     Last Vitals:  Vitals:   01/25/17 0414 01/25/17 0900  BP: 140/62 136/65  Pulse: 80 78  Resp: 18 18  Temp: 36.9 C 37.2 C  SpO2: 99% 100%    Last Pain:  Vitals:   01/25/17 1000  TempSrc:   PainSc: 0-No pain                 Alison Stalling

## 2017-02-05 NOTE — Addendum Note (Signed)
Addendum  created 02/05/17 1539 by Doreen Salvage, CRNA   Delete clinical note

## 2017-02-07 DIAGNOSIS — I2510 Atherosclerotic heart disease of native coronary artery without angina pectoris: Secondary | ICD-10-CM | POA: Diagnosis not present

## 2017-02-07 DIAGNOSIS — M1711 Unilateral primary osteoarthritis, right knee: Secondary | ICD-10-CM | POA: Diagnosis not present

## 2017-02-07 DIAGNOSIS — I10 Essential (primary) hypertension: Secondary | ICD-10-CM | POA: Diagnosis not present

## 2017-02-07 DIAGNOSIS — M25562 Pain in left knee: Secondary | ICD-10-CM | POA: Diagnosis not present

## 2017-02-07 DIAGNOSIS — Z471 Aftercare following joint replacement surgery: Secondary | ICD-10-CM | POA: Diagnosis not present

## 2017-02-07 DIAGNOSIS — Z96652 Presence of left artificial knee joint: Secondary | ICD-10-CM | POA: Diagnosis not present

## 2017-02-08 DIAGNOSIS — M25572 Pain in left ankle and joints of left foot: Secondary | ICD-10-CM | POA: Diagnosis not present

## 2017-03-13 DIAGNOSIS — M76822 Posterior tibial tendinitis, left leg: Secondary | ICD-10-CM | POA: Diagnosis not present

## 2017-03-13 DIAGNOSIS — M722 Plantar fascial fibromatosis: Secondary | ICD-10-CM | POA: Diagnosis not present

## 2017-03-13 DIAGNOSIS — M25572 Pain in left ankle and joints of left foot: Secondary | ICD-10-CM | POA: Diagnosis not present

## 2017-03-16 ENCOUNTER — Other Ambulatory Visit: Payer: Self-pay

## 2017-03-16 ENCOUNTER — Encounter
Admission: RE | Admit: 2017-03-16 | Discharge: 2017-03-16 | Disposition: A | Discharge status: Home / Self Care | Payer: Medicare Other | Source: Ambulatory Visit | Attending: Orthopedic Surgery | Admitting: Orthopedic Surgery

## 2017-03-16 DIAGNOSIS — Z01812 Encounter for preprocedural laboratory examination: Secondary | ICD-10-CM | POA: Diagnosis not present

## 2017-03-16 DIAGNOSIS — Z01810 Encounter for preprocedural cardiovascular examination: Secondary | ICD-10-CM | POA: Diagnosis not present

## 2017-03-16 DIAGNOSIS — I2510 Atherosclerotic heart disease of native coronary artery without angina pectoris: Secondary | ICD-10-CM | POA: Insufficient documentation

## 2017-03-16 DIAGNOSIS — R001 Bradycardia, unspecified: Secondary | ICD-10-CM | POA: Diagnosis not present

## 2017-03-16 DIAGNOSIS — I10 Essential (primary) hypertension: Secondary | ICD-10-CM | POA: Diagnosis not present

## 2017-03-16 LAB — URINALYSIS, ROUTINE W REFLEX MICROSCOPIC
BILIRUBIN URINE: NEGATIVE
Glucose, UA: NEGATIVE mg/dL
Hgb urine dipstick: NEGATIVE
KETONES UR: NEGATIVE mg/dL
LEUKOCYTES UA: NEGATIVE
NITRITE: NEGATIVE
PH: 6 (ref 5.0–8.0)
PROTEIN: NEGATIVE mg/dL
Specific Gravity, Urine: 1.003 — ABNORMAL LOW (ref 1.005–1.030)

## 2017-03-16 LAB — CBC
HEMATOCRIT: 38.8 % — AB (ref 40.0–52.0)
HEMOGLOBIN: 13.2 g/dL (ref 13.0–18.0)
MCH: 31.3 pg (ref 26.0–34.0)
MCHC: 33.9 g/dL (ref 32.0–36.0)
MCV: 92.4 fL (ref 80.0–100.0)
Platelets: 174 10*3/uL (ref 150–440)
RBC: 4.2 MIL/uL — ABNORMAL LOW (ref 4.40–5.90)
RDW: 14 % (ref 11.5–14.5)
WBC: 6.2 10*3/uL (ref 3.8–10.6)

## 2017-03-16 LAB — BASIC METABOLIC PANEL
ANION GAP: 8 (ref 5–15)
BUN: 13 mg/dL (ref 6–20)
CHLORIDE: 106 mmol/L (ref 101–111)
CO2: 26 mmol/L (ref 22–32)
Calcium: 9.3 mg/dL (ref 8.9–10.3)
Creatinine, Ser: 0.61 mg/dL (ref 0.61–1.24)
GFR calc non Af Amer: >60 mL/min (ref >60)
GLUCOSE: 100 mg/dL — AB (ref 65–99)
POTASSIUM: 4.3 mmol/L (ref 3.5–5.1)
Sodium: 140 mmol/L (ref 135–145)

## 2017-03-16 LAB — SURGICAL PCR SCREEN
MRSA, PCR: NEGATIVE
STAPHYLOCOCCUS AUREUS: NEGATIVE

## 2017-03-16 LAB — PROTIME-INR
INR: 1.01
Prothrombin Time: 13.2 seconds (ref 11.4–15.2)

## 2017-03-16 LAB — APTT: APTT: 34 s (ref 24–36)

## 2017-03-16 LAB — SEDIMENTATION RATE: Sed Rate: 8 mm/hr (ref 0–20)

## 2017-03-16 NOTE — Patient Instructions (Addendum)
Your procedure is scheduled on: Thursday, March 29, 2017 Report to Same Day Surgery on the 2nd floor in the [facility]. To find out your arrival time, please call [phone] between 1PM - 3PM on: Wednesday, March 28, 2017  REMEMBER: Instructions that are not followed completely may result in serious medical risk, up to and including death; or upon the discretion of your surgeon and anesthesiologist your surgery may need to be rescheduled.  Do not eat food after midnight the night before your procedure.  No gum chewing or hard candies.  You may however, drink CLEAR liquids up to 2 hours before you are scheduled to arrive at the hospital for your procedure.  Do not drink clear liquids within 2 hours of the start of your surgery.  Clear liquids include: - water  - apple juice without pulp - clear gatorade - black coffee or tea (Do NOT add anything to the coffee or tea) Do NOT drink anything that is not on this list.  No Alcohol for 24 hours before or after surgery.  No Smoking including e-cigarettes for 24 hours prior to surgery. No chewable tobacco products for at least 6 hours prior to surgery. No nicotine patches on the day of surgery.  On the morning of surgery brush your teeth with toothpaste and water, you may rinse your mouth with mouthwash if you wish. Do not swallow any  toothpaste of mouthwash.  Notify your doctor if there is any change in your medical condition (cold, fever, infection).  Do not wear jewelry, make-up, hairpins, clips or nail polish.  Do not wear lotions, powders, or perfumes. You may wear deodorant.  Do not shave 48 hours prior to surgery. Men may shave face and neck.  Contacts and dentures may not be worn into surgery.  Do not bring valuables to the hospital. [facility] is not responsible for any belongings or valuables.  TAKE THESE MEDICATIONS THE MORNING OF SURGERY WITH A SIP OF WATER:  1.  Amlodipine 2.  Carvedilol 3.  Hydrocodone  (if needed for pain) 4.  Pantoprazole (protonix)   Use CHG Soap as directed on instruction sheet.  On February 14 - Stop aspirin, Anti-inflammatories such as diclofenac, Advil, Aleve, Ibuprofen, Motrin, Naproxen, Naprosyn, Goodie powder, or aspirin products. (May take Tylenol or Acetaminophen if needed.)  On February 14 - Stop ANY OVER THE COUNTER supplements until after surgery. (fish oil, vitamin C)  If you are being admitted to the hospital overnight, leave your suitcase in the car. After surgery it may be brought to your room.  Please call the number above if you have any questions about these instructions.

## 2017-03-17 LAB — URINE CULTURE: Culture: NO GROWTH

## 2017-03-26 DIAGNOSIS — M84375A Stress fracture, left foot, initial encounter for fracture: Secondary | ICD-10-CM | POA: Diagnosis not present

## 2017-03-26 DIAGNOSIS — M25572 Pain in left ankle and joints of left foot: Secondary | ICD-10-CM | POA: Diagnosis not present

## 2017-03-28 MED ORDER — CEFAZOLIN SODIUM-DEXTROSE 2-4 GM/100ML-% IV SOLN
2.0000 g | Freq: Once | INTRAVENOUS | Status: AC
  Administered 2017-03-29: 2 g via INTRAVENOUS

## 2017-03-28 MED ORDER — SODIUM CHLORIDE 0.9 % IV SOLN
1000.0000 mg | INTRAVENOUS | Status: DC
  Filled 2017-03-28: qty 10

## 2017-03-29 ENCOUNTER — Inpatient Hospital Stay
Admission: AD | Admit: 2017-03-29 | Discharge: 2017-04-01 | DRG: 470 | Disposition: A | Discharge status: Home Health | Payer: Non-veteran care | Source: Ambulatory Visit | Attending: Orthopedic Surgery | Admitting: Orthopedic Surgery

## 2017-03-29 ENCOUNTER — Ambulatory Visit: Payer: Non-veteran care | Admitting: Anesthesiology

## 2017-03-29 ENCOUNTER — Encounter: Payer: Self-pay | Admitting: Anesthesiology

## 2017-03-29 ENCOUNTER — Encounter
Admission: AD | Disposition: A | Discharge status: Home Health | Payer: Self-pay | Source: Ambulatory Visit | Attending: Orthopedic Surgery

## 2017-03-29 ENCOUNTER — Inpatient Hospital Stay: Payer: Non-veteran care

## 2017-03-29 ENCOUNTER — Other Ambulatory Visit: Payer: Self-pay

## 2017-03-29 DIAGNOSIS — K219 Gastro-esophageal reflux disease without esophagitis: Secondary | ICD-10-CM | POA: Diagnosis present

## 2017-03-29 DIAGNOSIS — D62 Acute posthemorrhagic anemia: Secondary | ICD-10-CM | POA: Diagnosis not present

## 2017-03-29 DIAGNOSIS — M1711 Unilateral primary osteoarthritis, right knee: Secondary | ICD-10-CM | POA: Diagnosis not present

## 2017-03-29 DIAGNOSIS — G8918 Other acute postprocedural pain: Secondary | ICD-10-CM

## 2017-03-29 DIAGNOSIS — I10 Essential (primary) hypertension: Secondary | ICD-10-CM | POA: Diagnosis present

## 2017-03-29 DIAGNOSIS — Z87891 Personal history of nicotine dependence: Secondary | ICD-10-CM

## 2017-03-29 DIAGNOSIS — I2510 Atherosclerotic heart disease of native coronary artery without angina pectoris: Secondary | ICD-10-CM | POA: Diagnosis present

## 2017-03-29 DIAGNOSIS — E785 Hyperlipidemia, unspecified: Secondary | ICD-10-CM | POA: Diagnosis present

## 2017-03-29 HISTORY — PX: TOTAL KNEE ARTHROPLASTY: SHX125

## 2017-03-29 LAB — CREATININE, SERUM
Creatinine, Ser: 0.77 mg/dL (ref 0.61–1.24)
GFR calc Af Amer: >60 mL/min (ref >60)
GFR calc non Af Amer: >60 mL/min (ref >60)

## 2017-03-29 LAB — CBC
HCT: 36.4 % — ABNORMAL LOW (ref 40.0–52.0)
Hemoglobin: 12.2 g/dL — ABNORMAL LOW (ref 13.0–18.0)
MCH: 31 pg (ref 26.0–34.0)
MCHC: 33.7 g/dL (ref 32.0–36.0)
MCV: 92 fL (ref 80.0–100.0)
Platelets: 173 10*3/uL (ref 150–440)
RBC: 3.95 MIL/uL — AB (ref 4.40–5.90)
RDW: 14 % (ref 11.5–14.5)
WBC: 9.5 10*3/uL (ref 3.8–10.6)

## 2017-03-29 SURGERY — ARTHROPLASTY, KNEE, TOTAL
Anesthesia: General | Laterality: Right

## 2017-03-29 MED ORDER — MORPHINE SULFATE (PF) 2 MG/ML IV SOLN
2.0000 mg | INTRAVENOUS | Status: DC | PRN
  Administered 2017-03-29 (×2): 2 mg via INTRAVENOUS
  Filled 2017-03-29 (×2): qty 1

## 2017-03-29 MED ORDER — VITAMIN C 500 MG PO TABS
500.0000 mg | ORAL_TABLET | Freq: Every day | ORAL | Status: DC
  Administered 2017-03-29 – 2017-04-01 (×4): 500 mg via ORAL
  Filled 2017-03-29 (×4): qty 1

## 2017-03-29 MED ORDER — ACETAMINOPHEN 325 MG PO TABS
650.0000 mg | ORAL_TABLET | ORAL | Status: DC | PRN
  Administered 2017-03-29 – 2017-03-30 (×3): 650 mg via ORAL
  Filled 2017-03-29 (×3): qty 2

## 2017-03-29 MED ORDER — FENTANYL CITRATE (PF) 100 MCG/2ML IJ SOLN
INTRAMUSCULAR | Status: AC
  Filled 2017-03-29: qty 2

## 2017-03-29 MED ORDER — SODIUM CHLORIDE 0.9 % IV SOLN
INTRAVENOUS | Status: DC | PRN
  Administered 2017-03-29: 60 mL

## 2017-03-29 MED ORDER — CLONAZEPAM 0.5 MG PO TABS
1.0000 mg | ORAL_TABLET | Freq: Every day | ORAL | Status: DC
  Administered 2017-03-29 – 2017-03-31 (×3): 1 mg via ORAL
  Filled 2017-03-29 (×3): qty 2

## 2017-03-29 MED ORDER — HYDROMORPHONE HCL 1 MG/ML IJ SOLN
INTRAMUSCULAR | Status: AC
  Administered 2017-03-29: 0.5 mg via INTRAVENOUS
  Filled 2017-03-29: qty 1

## 2017-03-29 MED ORDER — ONDANSETRON HCL 4 MG/2ML IJ SOLN: 4.0000 mg | Freq: Four times a day (QID) | INTRAMUSCULAR | Status: DC | PRN

## 2017-03-29 MED ORDER — NEOMYCIN-POLYMYXIN B GU 40-200000 IR SOLN
Status: DC | PRN
  Administered 2017-03-29: 16 mL

## 2017-03-29 MED ORDER — ENOXAPARIN SODIUM 30 MG/0.3ML ~~LOC~~ SOLN
30.0000 mg | Freq: Two times a day (BID) | SUBCUTANEOUS | Status: DC
  Administered 2017-03-30 – 2017-04-01 (×5): 30 mg via SUBCUTANEOUS
  Filled 2017-03-29 (×5): qty 0.3

## 2017-03-29 MED ORDER — ONDANSETRON HCL 4 MG PO TABS: 4.0000 mg | ORAL_TABLET | Freq: Four times a day (QID) | ORAL | Status: DC | PRN

## 2017-03-29 MED ORDER — CEFAZOLIN SODIUM-DEXTROSE 2-4 GM/100ML-% IV SOLN
INTRAVENOUS | Status: AC
  Filled 2017-03-29: qty 100

## 2017-03-29 MED ORDER — ALUM & MAG HYDROXIDE-SIMETH 200-200-20 MG/5ML PO SUSP: 30.0000 mL | ORAL | Status: DC | PRN

## 2017-03-29 MED ORDER — DOCUSATE SODIUM 100 MG PO CAPS
100.0000 mg | ORAL_CAPSULE | Freq: Two times a day (BID) | ORAL | Status: DC
  Administered 2017-03-29 – 2017-04-01 (×6): 100 mg via ORAL
  Filled 2017-03-29 (×6): qty 1

## 2017-03-29 MED ORDER — CALCIUM CARBONATE 1500 (600 CA) MG PO TABS: 600.0000 mg | ORAL_TABLET | Freq: Every day | ORAL | Status: DC

## 2017-03-29 MED ORDER — METOCLOPRAMIDE HCL 5 MG/ML IJ SOLN: 5.0000 mg | Freq: Three times a day (TID) | INTRAMUSCULAR | Status: DC | PRN

## 2017-03-29 MED ORDER — OXYCODONE HCL 5 MG PO TABS
5.0000 mg | ORAL_TABLET | ORAL | Status: DC | PRN
  Administered 2017-03-29 (×3): 5 mg via ORAL
  Filled 2017-03-29 (×3): qty 1

## 2017-03-29 MED ORDER — ONDANSETRON HCL 4 MG/2ML IJ SOLN
INTRAMUSCULAR | Status: DC | PRN
  Administered 2017-03-29: 4 mg via INTRAVENOUS

## 2017-03-29 MED ORDER — LIDOCAINE HCL (PF) 1 % IJ SOLN
INTRAMUSCULAR | Status: AC
  Filled 2017-03-29: qty 5

## 2017-03-29 MED ORDER — HYDROMORPHONE HCL 1 MG/ML IJ SOLN
0.5000 mg | INTRAMUSCULAR | Status: DC | PRN
  Administered 2017-03-29 (×2): 0.5 mg via INTRAVENOUS

## 2017-03-29 MED ORDER — PROPOFOL 500 MG/50ML IV EMUL
INTRAVENOUS | Status: AC
  Filled 2017-03-29: qty 50

## 2017-03-29 MED ORDER — SODIUM CHLORIDE 0.9 % IV SOLN
INTRAVENOUS | Status: DC
  Administered 2017-03-29 – 2017-03-30 (×2): via INTRAVENOUS

## 2017-03-29 MED ORDER — FISH OIL 1200 MG PO CPDR: 1200.0000 mg | DELAYED_RELEASE_CAPSULE | Freq: Every day | ORAL | Status: DC

## 2017-03-29 MED ORDER — PHENYLEPHRINE HCL 10 MG/ML IJ SOLN
INTRAMUSCULAR | Status: DC | PRN
  Administered 2017-03-29: 100 ug via INTRAVENOUS

## 2017-03-29 MED ORDER — LACTATED RINGERS IV SOLN
INTRAVENOUS | Status: DC
  Administered 2017-03-29 (×3): via INTRAVENOUS

## 2017-03-29 MED ORDER — MAGNESIUM CITRATE PO SOLN
1.0000 | Freq: Once | ORAL | Status: AC | PRN
  Administered 2017-03-30: 1 via ORAL
  Filled 2017-03-29 (×2): qty 296

## 2017-03-29 MED ORDER — SODIUM CHLORIDE 0.9 % IJ SOLN
INTRAMUSCULAR | Status: DC | PRN
  Administered 2017-03-29: 68 mL via INTRAVENOUS

## 2017-03-29 MED ORDER — MIDAZOLAM HCL 2 MG/2ML IJ SOLN
INTRAMUSCULAR | Status: AC
  Filled 2017-03-29: qty 2

## 2017-03-29 MED ORDER — AMLODIPINE BESYLATE 5 MG PO TABS
5.0000 mg | ORAL_TABLET | Freq: Every day | ORAL | Status: DC
  Administered 2017-03-30 – 2017-04-01 (×3): 5 mg via ORAL
  Filled 2017-03-29 (×4): qty 1

## 2017-03-29 MED ORDER — ACETAMINOPHEN 10 MG/ML IV SOLN
INTRAVENOUS | Status: DC | PRN
  Administered 2017-03-29: 1000 mg via INTRAVENOUS

## 2017-03-29 MED ORDER — ATORVASTATIN CALCIUM 80 MG PO TABS
80.0000 mg | ORAL_TABLET | Freq: Every evening | ORAL | Status: DC
  Administered 2017-03-29 – 2017-03-31 (×3): 80 mg via ORAL
  Filled 2017-03-29 (×2): qty 4
  Filled 2017-03-29 (×3): qty 1
  Filled 2017-03-29: qty 4
  Filled 2017-03-29: qty 2

## 2017-03-29 MED ORDER — LIDOCAINE HCL (PF) 1 % IJ SOLN
INTRAMUSCULAR | Status: AC
  Filled 2017-03-29: qty 2

## 2017-03-29 MED ORDER — ROPIVACAINE HCL 5 MG/ML IJ SOLN
INTRAMUSCULAR | Status: DC | PRN
  Administered 2017-03-29: 20 mL via EPIDURAL

## 2017-03-29 MED ORDER — ALFUZOSIN HCL ER 10 MG PO TB24
10.0000 mg | ORAL_TABLET | Freq: Every day | ORAL | Status: DC
  Administered 2017-03-29 – 2017-03-31 (×3): 10 mg via ORAL
  Filled 2017-03-29 (×4): qty 1

## 2017-03-29 MED ORDER — BUPIVACAINE-EPINEPHRINE (PF) 0.25% -1:200000 IJ SOLN
INTRAMUSCULAR | Status: DC | PRN
  Administered 2017-03-29: 30 mL via PERINEURAL

## 2017-03-29 MED ORDER — FENTANYL CITRATE (PF) 100 MCG/2ML IJ SOLN
INTRAMUSCULAR | Status: DC | PRN
  Administered 2017-03-29 (×4): 50 ug via INTRAVENOUS

## 2017-03-29 MED ORDER — DEXAMETHASONE SODIUM PHOSPHATE 10 MG/ML IJ SOLN
INTRAMUSCULAR | Status: DC | PRN
  Administered 2017-03-29: 10 mg via INTRAVENOUS

## 2017-03-29 MED ORDER — METHOCARBAMOL 1000 MG/10ML IJ SOLN
500.0000 mg | Freq: Four times a day (QID) | INTRAVENOUS | Status: DC | PRN
  Filled 2017-03-29: qty 5

## 2017-03-29 MED ORDER — MIDAZOLAM HCL 2 MG/2ML IJ SOLN
INTRAMUSCULAR | Status: DC | PRN
  Administered 2017-03-29: 2 mg via INTRAVENOUS

## 2017-03-29 MED ORDER — CEFAZOLIN SODIUM-DEXTROSE 2-4 GM/100ML-% IV SOLN
2.0000 g | Freq: Four times a day (QID) | INTRAVENOUS | Status: AC
  Administered 2017-03-29 (×2): 2 g via INTRAVENOUS
  Filled 2017-03-29 (×2): qty 100

## 2017-03-29 MED ORDER — OMEGA-3-ACID ETHYL ESTERS 1 G PO CAPS
1.0000 g | ORAL_CAPSULE | Freq: Every day | ORAL | Status: DC
  Administered 2017-03-29 – 2017-04-01 (×4): 1 g via ORAL
  Filled 2017-03-29 (×4): qty 1

## 2017-03-29 MED ORDER — GLYCOPYRROLATE 0.2 MG/ML IJ SOLN
INTRAMUSCULAR | Status: DC | PRN
  Administered 2017-03-29: 0.2 mg via INTRAVENOUS

## 2017-03-29 MED ORDER — FENTANYL CITRATE (PF) 100 MCG/2ML IJ SOLN
25.0000 ug | INTRAMUSCULAR | Status: DC | PRN
  Administered 2017-03-29 (×4): 25 ug via INTRAVENOUS

## 2017-03-29 MED ORDER — CALCIUM CARBONATE 1250 (500 CA) MG PO TABS
1.0000 | ORAL_TABLET | Freq: Every day | ORAL | Status: DC
  Filled 2017-03-29: qty 1

## 2017-03-29 MED ORDER — SUGAMMADEX SODIUM 200 MG/2ML IV SOLN
INTRAVENOUS | Status: DC | PRN
  Administered 2017-03-29: 118 mg via INTRAVENOUS

## 2017-03-29 MED ORDER — PANTOPRAZOLE SODIUM 40 MG PO TBEC
40.0000 mg | DELAYED_RELEASE_TABLET | Freq: Two times a day (BID) | ORAL | Status: DC
  Administered 2017-03-29 – 2017-04-01 (×6): 40 mg via ORAL
  Filled 2017-03-29 (×6): qty 1

## 2017-03-29 MED ORDER — METOCLOPRAMIDE HCL 10 MG PO TABS: 5.0000 mg | ORAL_TABLET | Freq: Three times a day (TID) | ORAL | Status: DC | PRN

## 2017-03-29 MED ORDER — FERROUS SULFATE 325 (65 FE) MG PO TABS
325.0000 mg | ORAL_TABLET | Freq: Every day | ORAL | Status: DC
  Administered 2017-03-30 – 2017-04-01 (×3): 325 mg via ORAL
  Filled 2017-03-29 (×3): qty 1

## 2017-03-29 MED ORDER — SUGAMMADEX SODIUM 200 MG/2ML IV SOLN
INTRAVENOUS | Status: AC
  Filled 2017-03-29: qty 2

## 2017-03-29 MED ORDER — ROCURONIUM BROMIDE 100 MG/10ML IV SOLN
INTRAVENOUS | Status: DC | PRN
  Administered 2017-03-29: 40 mg via INTRAVENOUS
  Administered 2017-03-29: 10 mg via INTRAVENOUS

## 2017-03-29 MED ORDER — ACETAMINOPHEN 10 MG/ML IV SOLN
INTRAVENOUS | Status: AC
  Filled 2017-03-29: qty 100

## 2017-03-29 MED ORDER — ACETAMINOPHEN 650 MG RE SUPP: 650.0000 mg | RECTAL | Status: DC | PRN

## 2017-03-29 MED ORDER — HYDROMORPHONE HCL 1 MG/ML IJ SOLN
0.5000 mg | INTRAMUSCULAR | Status: AC | PRN
  Administered 2017-03-29 (×4): 0.5 mg via INTRAVENOUS

## 2017-03-29 MED ORDER — MORPHINE SULFATE 10 MG/ML IJ SOLN
INTRAMUSCULAR | Status: DC | PRN
Start: 2017-03-29 — End: 2017-03-29
  Administered 2017-03-29: 10 mg via INTRAVENOUS

## 2017-03-29 MED ORDER — METHOCARBAMOL 500 MG PO TABS
500.0000 mg | ORAL_TABLET | Freq: Four times a day (QID) | ORAL | Status: DC | PRN
  Administered 2017-03-29 – 2017-04-01 (×8): 500 mg via ORAL
  Filled 2017-03-29 (×8): qty 1

## 2017-03-29 MED ORDER — SUCCINYLCHOLINE CHLORIDE 20 MG/ML IJ SOLN
INTRAMUSCULAR | Status: DC | PRN
  Administered 2017-03-29: 100 mg via INTRAVENOUS

## 2017-03-29 MED ORDER — LIDOCAINE HCL (CARDIAC) 20 MG/ML IV SOLN
INTRAVENOUS | Status: DC | PRN
  Administered 2017-03-29: 50 mg via INTRAVENOUS
  Administered 2017-03-29: 100 mg via INTRAVENOUS

## 2017-03-29 MED ORDER — MAGNESIUM HYDROXIDE 400 MG/5ML PO SUSP
30.0000 mL | Freq: Every day | ORAL | Status: DC | PRN
  Administered 2017-03-29 – 2017-04-01 (×3): 30 mL via ORAL
  Filled 2017-03-29 (×3): qty 30

## 2017-03-29 MED ORDER — OXYCODONE HCL 5 MG PO TABS
10.0000 mg | ORAL_TABLET | ORAL | Status: DC | PRN
  Administered 2017-03-30 – 2017-04-01 (×16): 10 mg via ORAL
  Filled 2017-03-29 (×16): qty 2

## 2017-03-29 MED ORDER — DIPHENHYDRAMINE HCL 12.5 MG/5ML PO ELIX
12.5000 mg | ORAL_SOLUTION | ORAL | Status: DC | PRN
  Filled 2017-03-29: qty 5

## 2017-03-29 MED ORDER — SODIUM CHLORIDE 0.9 % IV SOLN
INTRAVENOUS | Status: DC | PRN
  Administered 2017-03-29: 50 ug/min via INTRAVENOUS

## 2017-03-29 MED ORDER — CARVEDILOL 3.125 MG PO TABS
3.1250 mg | ORAL_TABLET | Freq: Two times a day (BID) | ORAL | Status: DC
  Administered 2017-03-29 – 2017-04-01 (×6): 3.125 mg via ORAL
  Filled 2017-03-29 (×6): qty 1

## 2017-03-29 MED ORDER — LISINOPRIL 10 MG PO TABS
10.0000 mg | ORAL_TABLET | Freq: Every day | ORAL | Status: DC
  Administered 2017-03-29 – 2017-03-31 (×3): 10 mg via ORAL
  Filled 2017-03-29 (×4): qty 1

## 2017-03-29 MED ORDER — PROPOFOL 10 MG/ML IV BOLUS
INTRAVENOUS | Status: DC | PRN
  Administered 2017-03-29: 140 mg via INTRAVENOUS

## 2017-03-29 MED ORDER — ROPIVACAINE HCL 5 MG/ML IJ SOLN
INTRAMUSCULAR | Status: AC
  Filled 2017-03-29: qty 30

## 2017-03-29 MED ORDER — ONDANSETRON HCL 4 MG/2ML IJ SOLN: 4.0000 mg | Freq: Once | INTRAMUSCULAR | Status: DC | PRN

## 2017-03-29 MED ORDER — PHENOL 1.4 % MT LIQD
1.0000 | OROMUCOSAL | Status: DC | PRN
  Filled 2017-03-29: qty 177

## 2017-03-29 MED ORDER — FENTANYL CITRATE (PF) 100 MCG/2ML IJ SOLN
INTRAMUSCULAR | Status: AC
  Administered 2017-03-29: 25 ug via INTRAVENOUS
  Filled 2017-03-29: qty 2

## 2017-03-29 MED ORDER — ZOLPIDEM TARTRATE 5 MG PO TABS
5.0000 mg | ORAL_TABLET | Freq: Every evening | ORAL | Status: DC | PRN
  Administered 2017-03-29: 5 mg via ORAL
  Filled 2017-03-29: qty 1

## 2017-03-29 MED ORDER — MENTHOL 3 MG MT LOZG
1.0000 | LOZENGE | OROMUCOSAL | Status: DC | PRN
  Filled 2017-03-29: qty 9

## 2017-03-29 MED ORDER — BISACODYL 10 MG RE SUPP
10.0000 mg | Freq: Every day | RECTAL | Status: DC | PRN
  Administered 2017-04-01: 10 mg via RECTAL
  Filled 2017-03-29: qty 1

## 2017-03-29 SURGICAL SUPPLY — 61 items
BANDAGE ACE 6X5 VEL STRL LF (GAUZE/BANDAGES/DRESSINGS) ×3 IMPLANT
BLADE SAW 1 (BLADE) ×6 IMPLANT
BLOCK CUTTING FEMUR 3 RT MED (MISCELLANEOUS) IMPLANT
BLOCK CUTTING TIBIAL 3 RT (MISCELLANEOUS) IMPLANT
BLOCK CUTTING TIBIAL 4 RT MIS (MISCELLANEOUS) IMPLANT
CANISTER SUCT 1200ML W/VALVE (MISCELLANEOUS) ×3 IMPLANT
CANISTER SUCT 3000ML PPV (MISCELLANEOUS) ×6 IMPLANT
CAPT KNEE TOTAL 3 ×3 IMPLANT
CEMENT FEMORAL COMP SZ3 RT (Femur) ×3 IMPLANT
CEMENT HV SMART SET (Cement) ×6 IMPLANT
CHLORAPREP W/TINT 26ML (MISCELLANEOUS) ×6 IMPLANT
COOLER POLAR GLACIER W/PUMP (MISCELLANEOUS) ×3 IMPLANT
CUFF TOURN 24 STER (MISCELLANEOUS) ×3 IMPLANT
CUFF TOURN 30 STER DUAL PORT (MISCELLANEOUS) IMPLANT
DRAPE SHEET LG 3/4 BI-LAMINATE (DRAPES) ×6 IMPLANT
ELECT CAUTERY BLADE 6.4 (BLADE) ×3 IMPLANT
ELECT REM PT RETURN 9FT ADLT (ELECTROSURGICAL) ×3
ELECTRODE REM PT RTRN 9FT ADLT (ELECTROSURGICAL) ×1 IMPLANT
GAUZE PETRO XEROFOAM 1X8 (MISCELLANEOUS) ×3 IMPLANT
GAUZE SPONGE 4X4 12PLY STRL (GAUZE/BANDAGES/DRESSINGS) ×3 IMPLANT
GLOVE BIOGEL PI IND STRL 9 (GLOVE) ×1 IMPLANT
GLOVE BIOGEL PI INDICATOR 9 (GLOVE) ×2
GLOVE INDICATOR 8.0 STRL GRN (GLOVE) ×3 IMPLANT
GLOVE SURG ORTHO 8.0 STRL STRW (GLOVE) ×3 IMPLANT
GLOVE SURG SYN 9.0  PF PI (GLOVE) ×2
GLOVE SURG SYN 9.0 PF PI (GLOVE) ×1 IMPLANT
GOWN SRG 2XL LVL 4 RGLN SLV (GOWNS) ×1 IMPLANT
GOWN STRL NON-REIN 2XL LVL4 (GOWNS) ×2
GOWN STRL REUS W/ TWL LRG LVL3 (GOWN DISPOSABLE) ×1 IMPLANT
GOWN STRL REUS W/ TWL XL LVL3 (GOWN DISPOSABLE) ×1 IMPLANT
GOWN STRL REUS W/TWL LRG LVL3 (GOWN DISPOSABLE) ×2
GOWN STRL REUS W/TWL XL LVL3 (GOWN DISPOSABLE) ×2
HOLDER FOLEY CATH W/STRAP (MISCELLANEOUS) ×3 IMPLANT
HOOD PEEL AWAY FLYTE STAYCOOL (MISCELLANEOUS) ×6 IMPLANT
IMMBOLIZER KNEE 19 BLUE UNIV (SOFTGOODS) ×3 IMPLANT
KIT TURNOVER KIT A (KITS) ×3 IMPLANT
KNEE MEDACTA TIBIAL/FEMORAL BL (Knees) ×3 IMPLANT
KNIFE SCULPS 14X20 (INSTRUMENTS) ×3 IMPLANT
NDL SAFETY ECLIPSE 18X1.5 (NEEDLE) ×1 IMPLANT
NEEDLE HYPO 18GX1.5 SHARP (NEEDLE) ×2
NEEDLE SPNL 18GX3.5 QUINCKE PK (NEEDLE) ×3 IMPLANT
NEEDLE SPNL 20GX3.5 QUINCKE YW (NEEDLE) ×3 IMPLANT
NS IRRIG 1000ML POUR BTL (IV SOLUTION) ×3 IMPLANT
PACK TOTAL KNEE (MISCELLANEOUS) ×3 IMPLANT
PAD WRAPON POLAR KNEE (MISCELLANEOUS) ×1 IMPLANT
PULSAVAC PLUS IRRIG FAN TIP (DISPOSABLE) ×3
SOL .9 NS 3000ML IRR  AL (IV SOLUTION) ×2
SOL .9 NS 3000ML IRR UROMATIC (IV SOLUTION) ×1 IMPLANT
STAPLER SKIN PROX 35W (STAPLE) ×3 IMPLANT
SUCTION FRAZIER HANDLE 10FR (MISCELLANEOUS) ×2
SUCTION TUBE FRAZIER 10FR DISP (MISCELLANEOUS) ×1 IMPLANT
SUT DVC 2 QUILL PDO  T11 36X36 (SUTURE) ×2
SUT DVC 2 QUILL PDO T11 36X36 (SUTURE) ×1 IMPLANT
SUT V-LOC 90 ABS DVC 3-0 CL (SUTURE) ×3 IMPLANT
SYR 20CC LL (SYRINGE) ×3 IMPLANT
SYR 50ML LL SCALE MARK (SYRINGE) ×6 IMPLANT
TIP FAN IRRIG PULSAVAC PLUS (DISPOSABLE) ×1 IMPLANT
TOWEL OR 17X26 4PK STRL BLUE (TOWEL DISPOSABLE) ×3 IMPLANT
TOWER CARTRIDGE SMART MIX (DISPOSABLE) ×3 IMPLANT
TRAY FOLEY W/METER SILVER 16FR (SET/KITS/TRAYS/PACK) ×3 IMPLANT
WRAPON POLAR PAD KNEE (MISCELLANEOUS) ×3

## 2017-03-29 NOTE — Transfer of Care (Signed)
Immediate Anesthesia Transfer of Care Note  Patient: Donald [name]  Procedure(s) Performed: TOTAL KNEE ARTHROPLASTY (Right )  Patient Location: PACU  Anesthesia Type:General  Level of Consciousness: awake and sedated  Airway & Oxygen Therapy: Patient Spontanous Breathing and Patient connected to face mask oxygen  Post-op Assessment: Report given to RN and Post -op Vital signs reviewed and stable  Post vital signs: Reviewed and stable  Last Vitals:  Vitals:   03/29/17 0611 03/29/17 0911  BP: 113/63 138/77  Pulse: 77 77  Resp: 15 (!) 8  Temp: 36.6 C (!) 36.3 C  SpO2: 97% 100%    Last Pain:  Vitals:   03/29/17 0611  TempSrc: Oral  PainSc: 9          Complications: No apparent anesthesia complications

## 2017-03-29 NOTE — Anesthesia Post-op Follow-up Note (Signed)
Anesthesia QCDR form completed.        

## 2017-03-29 NOTE — Progress Notes (Signed)
Pt is in no acute distress. A&OX4. No adventitious heart or lung sounds. Dressing clean, dry, and intact. Can wiggle toes and has full sensation to both lower extremities. Pedal pulses 3+ bilaterally. Will continue to monitor.

## 2017-03-29 NOTE — Anesthesia Procedure Notes (Signed)
Procedure Name: Intubation Date/Time: 03/29/2017 7:30 AM Performed by: Nelda Marseille, CRNA Pre-anesthesia Checklist: Patient identified, Patient being monitored, Timeout performed, Emergency Drugs available and Suction available Patient Re-evaluated:Patient Re-evaluated prior to induction Oxygen Delivery Method: Circle system utilized Preoxygenation: Pre-oxygenation with 100% oxygen Induction Type: IV induction Ventilation: Mask ventilation without difficulty Laryngoscope Size: Mac and 3 Grade View: Grade I Tube type: Oral Tube size: 7.0 mm Number of attempts: 1 Airway Equipment and Method: Stylet Placement Confirmation: ETT inserted through vocal cords under direct vision,  positive ETCO2 and breath sounds checked- equal and bilateral Secured at: 21 cm Tube secured with: Tape Dental Injury: Teeth and Oropharynx as per pre-operative assessment

## 2017-03-29 NOTE — Anesthesia Preprocedure Evaluation (Addendum)
Anesthesia Evaluation  Patient identified by MRN, date of birth, ID band Patient awake    Reviewed: Allergy & Precautions, NPO status , Patient's Chart, lab work & pertinent test results, reviewed documented beta blocker date and time   Airway Mallampati: II  TM Distance: >3 FB     Dental  (+) Upper Dentures, Lower Dentures   Pulmonary former smoker,           Cardiovascular hypertension, Pt. on medications and Pt. on home beta blockers + CAD and + Cardiac Stents       Neuro/Psych    GI/Hepatic PUD, GERD  Controlled,  Endo/Other    Renal/GU      Musculoskeletal  (+) Arthritis ,   Abdominal   Peds  Hematology  (+) anemia ,   Anesthesia Other Findings   Reproductive/Obstetrics                            Anesthesia Physical Anesthesia Plan  ASA: III  Anesthesia Plan: Spinal   Post-op Pain Management:    Induction:   PONV Risk Score and Plan:   Airway Management Planned:   Additional Equipment:   Intra-op Plan:   Post-operative Plan:   Informed Consent: I have reviewed the patients History and Physical, chart, labs and discussed the procedure including the risks, benefits and alternatives for the proposed anesthesia with the patient or authorized representative who has indicated his/her understanding and acceptance.     Plan Discussed with: CRNA  Anesthesia Plan Comments:         Anesthesia Quick Evaluation

## 2017-03-29 NOTE — H&P (Signed)
Reviewed paper H+P, will be scanned into chart. No changes noted.  

## 2017-03-29 NOTE — Anesthesia Procedure Notes (Signed)
Anesthesia Regional Block: Femoral nerve block   Pre-Anesthetic Checklist: ,, timeout performed, Correct Patient, Correct Site, Correct Laterality, Correct Procedure, Correct Position, site marked, Risks and benefits discussed,  Surgical consent,  Pre-op evaluation,  At surgeon's request and post-op pain management   Prep: chloraprep       Needles:  Injection technique: Single-shot  Needle Type: Echogenic Needle     Needle Length: 9cm  Needle Gauge: 21     Additional Needles:   Procedures:,,,, ultrasound used (permanent image in chart),,,,  Narrative:  Injection made incrementally with aspirations every 5 mL.  Performed by: Personally       

## 2017-03-29 NOTE — Care Management (Signed)
I could not control his pain with narcs. Femioral nerve block.

## 2017-03-29 NOTE — Care Management (Signed)
PCP Dr. [name] [place] [phone]. Message left for [facility] Case Worker [name] to call this RNCM to discuss DME needs/Lovenox needs.

## 2017-03-29 NOTE — Op Note (Signed)
03/29/2017  9:07 AM  PATIENT:  Donald [name]  72 y.o. male  PRE-OPERATIVE DIAGNOSIS:  PRIMARY OSTEOARTHRITIS OF RIGHT KNEE  POST-OPERATIVE DIAGNOSIS:  PRIMARY OSTEOARTHRITIS OF RIGHT KNEE  PROCEDURE:  Procedure(s): TOTAL KNEE ARTHROPLASTY (Right)  SURGEON: Laurene Footman, MD  ASSISTANTS: Rachelle Hora PA-C  ANESTHESIA:   general  EBL:  Total I/O In: 1000 [I.V.:1000] Out: 125 [Urine:100; Blood:25]  BLOOD ADMINISTERED:none  DRAINS: none   LOCAL MEDICATIONS USED:  MARCAINE    and OTHER Exparel and morphine  SPECIMEN:  No Specimen  DISPOSITION OF SPECIMEN:  N/A  COUNTS:  YES  TOURNIQUET:   Total Tourniquet Time Documented: Thigh (Right) - 55 minutes Total: Thigh (Right) - 55 minutes   IMPLANTS:Medacta GMK sphere 3 femur, 3 tibia with short stem 10 mm, 3 patella all components cemented  DICTATION: .Dragon Dictationpatient brought the operating room and after adequate general anesthesia was obtainedrightleg was prepped and draped in sterile fashion. After patient identification and timeout procedures were completed a midline skin incision was made followed by medial parapatellar arthrotomy. The knee revealed Extensive erosion of the medial compartment with significant loss of bone as well as advanced patellofemoral degenerative change with exposed bone and mild lateral compartment degenerative change. There is chondrocalcinosis throughout the joint noted. The fat pad and anterior cruciate ligament and PCL were excised and the proximal tibia cutting guide was applied to the proximal tibia and axial tibia cut carried out followed by distal femur cut. Tourniquet raised to this point secondary to significant oozing from the bone. The 4-in-1 cutting guide was applied to the femur anterior posterior and chamfer cuts made. The posterior horns of the menisci could be resected this time and the size3tibia baseplate trial was placed in apporpriote rotation.. The keel punch was  placed followed by the 42femoral trial and a 41mm insert gave good stability through range of motion. This was thenchosen as the final implant. Distal femur drill holes were made followed by the trochlear groove cut and then trials were removed. The patella was affected with arthritis as welland resection was carried out drilling plate carried out and then a size 3 patella fit well. These trials were all removed and tourniquet wasraisedat this point with hemostasis checked withelectrocautery. Thelocal anesthetic noted above was infiltrated in the para-articular tissue. The the bony surfaces thoroughly irrigated and dried. Tibial component was cemented in place first followed by the plastic insert and then the femoral component and the knee placed in extension with excess cement being removed. Next the patellar button was clamped into place and after the cement entirely set the clamp was removed the patella tracked well. .the tourniquetwas letdown,the knee was thoroughly irrigated and then closed with a heavy Quill with 3-0 locking suture followed by skin staples., Xeroform 4 x 4's ABDs and web roll and Ace wrap along with a Polar Care    PLAN OF CARE: Admit to inpatient   PATIENT DISPOSITION:  PACU - hemodynamically stable.

## 2017-03-29 NOTE — Evaluation (Signed)
Physical Therapy Evaluation Patient Details Name: Donald [name] MRN: [id number] DOB: 12/29/1945 Today's Date: 03/29/2017   History of Present Illness  admitted for acute hospitalization status post R TKR (03/29/17), WBAT.  Clinical Impression  Upon evaluation, patient alert and oriented; follows all commands and demonstrates good effort with all mobility tasks despite pain rating 7-8/10.  R LE generally guarded due to pain/soreness, but demonstrates strength at least 3-/5, ROM 8-85 degrees.  Patient very eager/motivated to participate and progress towards full recovery.  Able to complete bed mobility with min assist; sit/stand, basic transfers and gait (5') with RW, min assist.  Very limited active use/WBing of R LE with transfers and gait efforts at this time, but anticipate rapid improvement as pain controlled. Would benefit from skilled PT to address above deficits and promote optimal return to PLOF;d Recommend transition to HHPT upon discharge from acute hospitalization.  Of note, patient reports recent diagnosis of L foot stress fracture; has boot that he wears "as needed".  Does not feel he needs/wants to use it at this time.    Follow Up Recommendations Home health PT    Equipment Recommendations  Rolling walker with 5" wheels    Recommendations for Other Services       Precautions / Restrictions Precautions Precautions: Fall Restrictions Weight Bearing Restrictions: Yes RLE Weight Bearing: Weight bearing as tolerated      Mobility  Bed Mobility Overal bed mobility: Needs Assistance Bed Mobility: Supine to Sit     Supine to sit: Min assist     General bed mobility comments: for R LE management  Transfers Overall transfer level: Needs assistance Equipment used: Rolling walker (2 wheeled) Transfers: Sit to/from Stand Sit to Stand: Min assist         General transfer comment: minimal active use/WBing in R LE with movement transition; heavy use of bilat UEs to  assist with lift off, standing balance  Ambulation/Gait Ambulation/Gait assistance: Min assist Ambulation Distance (Feet): 5 Feet Assistive device: Rolling walker (2 wheeled)       General Gait Details: 3-point, step to gait pattern; very slow and guarded with limited WBing R LE  Stairs            Wheelchair Mobility    Modified Rankin (Stroke Patients Only)       Balance Overall balance assessment: Needs assistance Sitting-balance support: No upper extremity supported;Feet supported Sitting balance-Leahy Scale: Good     Standing balance support: Bilateral upper extremity supported Standing balance-Leahy Scale: Fair                               Pertinent Vitals/Pain Pain Assessment: 0-10 Pain Score: 7  Pain Location: R knee Pain Descriptors / Indicators: Aching;Grimacing;Guarding Pain Intervention(s): Monitored during session;Limited activity within patient's tolerance;Repositioned;Patient requesting pain meds-RN notified;RN gave pain meds during session    [place] expects to be discharged to:: Private residence Living Arrangements: Spouse/significant other Available Help at Discharge: Family Type of Home: House Home Access: Stairs to enter Entrance Stairs-Rails: None   Home Layout: One level Home Equipment: Cane - single point;Hand held shower head;Adaptive equipment      Prior Function Level of Independence: Independent with assistive device(s)         Comments: Mod indep for ADLs, household and community mobilization with [facility]; denies fall history.  Recent difficulty with L foot pain (diagnosed with stress fracture by podiatrist per patient); wore boot temporarily,  but only wears now on as needed basis.     Hand Dominance        Extremity/Trunk Assessment   Upper Extremity Assessment Upper Extremity Assessment: Overall WFL for tasks assessed    Lower Extremity Assessment Lower Extremity Assessment: (R LE  grossly 3-/5 throughout, full sensory return; L LE grossly WFL)       Communication   Communication: No difficulties  Cognition Arousal/Alertness: Awake/alert Behavior During Therapy: WFL for tasks assessed/performed Overall Cognitive Status: Within Functional Limits for tasks assessed                                        General Comments      Exercises Total Joint Exercises Goniometric ROM: R knee, 8-85 degrees act assist  Other Exercises Other Exercises: Supine LE therex, 1x10, R LE: ankle pumps, quad sets, SAQs, heel slides, hip abduct/adduct and SLR.  Fair quad activation; difficulty with quad termination/relaxation at times (very guarded/tense due to pain)   Assessment/Plan    PT Assessment Patient needs continued PT services  PT Problem List Decreased strength;Decreased range of motion;Decreased activity tolerance;Decreased balance;Decreased mobility;Decreased knowledge of use of DME;Decreased safety awareness;Decreased knowledge of precautions;Decreased skin integrity;Pain       PT Treatment Interventions DME instruction;Therapeutic activities;Gait training;Therapeutic exercise;Stair training;Functional mobility training;Patient/family education    PT Goals (Current goals can be found in the Care Plan section)  Acute Rehab PT Goals Patient Stated Goal: to return home PT Goal Formulation: With patient/family Time For Goal Achievement: 04/12/17 Potential to Achieve Goals: Good    Frequency BID   Barriers to discharge        Co-evaluation               AM-PAC PT "6 Clicks" Daily Activity  Outcome Measure Difficulty turning over in bed (including adjusting bedclothes, sheets and blankets)?: Unable Difficulty moving from lying on back to sitting on the side of the bed? : Unable Difficulty sitting down on and standing up from a chair with arms (e.g., wheelchair, bedside commode, etc,.)?: Unable Help needed moving to and from a bed to chair  (including a wheelchair)?: A Little Help needed walking in hospital room?: A Little Help needed climbing 3-5 steps with a railing? : A Lot 6 Click Score: 11    End of Session Equipment Utilized During Treatment: Gait belt Activity Tolerance: Patient tolerated treatment well Patient left: in chair;with call bell/phone within reach;with chair alarm set Nurse Communication: Mobility status PT Visit Diagnosis: Difficulty in walking, not elsewhere classified (R26.2);Pain Pain - Right/Left: Right Pain - part of body: Knee    Time: 1519-1550 PT Time Calculation (min) (ACUTE ONLY): 31 min   Charges:   PT Evaluation $PT Eval Moderate Complexity: 1 Mod PT Treatments $Therapeutic Exercise: 8-22 mins   PT G Codes:       Leilanni Halvorson H. Owens Shark, PT, DPT, NCS 03/29/17, 5:25 PM [phone]

## 2017-03-30 LAB — CBC
HEMATOCRIT: 31.4 % — AB (ref 40.0–52.0)
Hemoglobin: 10.7 g/dL — ABNORMAL LOW (ref 13.0–18.0)
MCH: 31.2 pg (ref 26.0–34.0)
MCHC: 34.1 g/dL (ref 32.0–36.0)
MCV: 91.7 fL (ref 80.0–100.0)
PLATELETS: 152 10*3/uL (ref 150–440)
RBC: 3.43 MIL/uL — ABNORMAL LOW (ref 4.40–5.90)
RDW: 13.8 % (ref 11.5–14.5)
WBC: 8.5 10*3/uL (ref 3.8–10.6)

## 2017-03-30 LAB — BASIC METABOLIC PANEL
Anion gap: 6 (ref 5–15)
BUN: 14 mg/dL (ref 6–20)
CALCIUM: 8.4 mg/dL — AB (ref 8.9–10.3)
CO2: 23 mmol/L (ref 22–32)
Chloride: 109 mmol/L (ref 101–111)
Creatinine, Ser: 0.78 mg/dL (ref 0.61–1.24)
GFR calc Af Amer: >60 mL/min (ref >60)
GLUCOSE: 173 mg/dL — AB (ref 65–99)
Potassium: 4.2 mmol/L (ref 3.5–5.1)
Sodium: 138 mmol/L (ref 135–145)

## 2017-03-30 MED ORDER — CALCIUM CARBONATE ANTACID 500 MG PO CHEW
500.0000 mg | CHEWABLE_TABLET | Freq: Every day | ORAL | Status: DC
  Administered 2017-03-31 – 2017-04-01 (×2): 500 mg via ORAL
  Filled 2017-03-30 (×3): qty 3

## 2017-03-30 NOTE — Anesthesia Postprocedure Evaluation (Signed)
Anesthesia Post Note  Patient: Donald [name]  Procedure(s) Performed: TOTAL KNEE ARTHROPLASTY (Right )  Patient location during evaluation: Nursing Unit Anesthesia Type: General Level of consciousness: awake, awake and alert and oriented Pain management: pain level controlled Vital Signs Assessment: vitals unstable and post-procedure vital signs reviewed and stable Respiratory status: spontaneous breathing and nonlabored ventilation Cardiovascular status: blood pressure returned to baseline and stable Postop Assessment: no headache Anesthetic complications: no Comments: Spinal did not work, patient has had several back surgeries with scar tissue.  Aborted spinal and went to General, patient received femoral nerve block and is now having moderate pain.  Seems controlled. VSS     Last Vitals:  Vitals:   03/30/17 0336 03/30/17 0758  BP: (!) 131/59 (!) 159/58  Pulse: 69 66  Resp: 18 17  Temp: 36.6 C 37.2 C  SpO2: 97% 96%    Last Pain:  Vitals:   03/30/17 0758  TempSrc: Axillary  PainSc:                  Donald [name]

## 2017-03-30 NOTE — Care Management Note (Signed)
Case Management Note  Patient Details  Name: Donald [name] MRN: [id number] Date of Birth: 04-Sep-1945  Subjective/Objective:                   RNCM met with patient to discuss transition of care. He plans to return home with his wife.  He has a front-wheeled walker.  He would like to use Aspirin again at discharge as he had a hard time getting Lovenox last visit from [facility]. He would like to use [facility] for home health PT. Action/Plan: Referral to [facility].   Expected Discharge Date:                  Expected Discharge Plan:     In-House Referral:     Discharge planning Services  CM Consult  Post Acute Care Choice:  Home Health Choice offered to:  Patient  DME Arranged:    DME Agency:     HH Arranged:    [place]:  [facility] (now [facility])  Status of Service:  In process, will continue to follow  If discussed at Long Length of Stay Meetings, dates discussed:    Additional Comments:  Marshell Garfinkel, RN 03/30/2017, 8:30 AM

## 2017-03-30 NOTE — Care Management (Signed)
After about 30 minutes of waiting on the phone with [facility] and speaking to four different people- I spoke with Colette Ribas exy [phone] with [facility]. She said she wants patient to be seen by [facility] home health and will fax authorization for home health to this RNCM so that I can share with [facility].  I have cancelled HHPT with [facility]; patient updated. Malachy Mood with [facility] has accepted patient for home health and will see patient on Sunday.

## 2017-03-30 NOTE — Progress Notes (Signed)
OT Cancellation Note  Patient Details Name: SCHYLER COUNSELL MRN: [id number] DOB: 11/29/1945   Cancelled Treatment:    Reason Eval/Treat Not Completed: Patient declined, no reason specified(Pt. reports having had a Left TKR this past December, and reports that he anticipates no OT needs following this surgery as well.) Will complete the order.  Harrel Carina, MS, OTR/L 03/30/2017, 9:41 AM

## 2017-03-30 NOTE — Progress Notes (Signed)
PT Cancellation Note  Patient Details Name: Donald [name] MRN: [id number] DOB: November 28, 1945   Cancelled Treatment:    Reason Eval/Treat Not Completed: Pain limiting ability to participate(Treatment session attempted.  Patient refusing participation due to uncontrolled pain at this time-denies bed-level activity, repositioning or OOB attempts.  RN aware of patient's pain ratings, administering medications as able.  Will continue efforts next date per patient request.)   Rett Stehlik H. Owens Shark, PT, DPT, NCS 03/30/17, 3:54 PM [phone]

## 2017-03-30 NOTE — Progress Notes (Signed)
   Subjective: 1 Day Post-Op Procedure(s) (LRB): TOTAL KNEE ARTHROPLASTY (Right) Patient reports pain as moderate.   Patient is well, and has had no acute complaints or problems Denies any CP, SOB, ABD pain. We will continue therapy today.  Plan is to go Home after hospital stay.  Objective: Vital signs in last 24 hours: Temp:  [97.4 F (36.3 C)-99 F (37.2 C)] 99 F (37.2 C) (02/22 0758) Pulse Rate:  [60-89] 66 (02/22 0758) Resp:  [12-19] 17 (02/22 0758) BP: (128-159)/(56-80) 159/58 (02/22 0758) SpO2:  [96 %-99 %] 96 % (02/22 0758) FiO2 (%):  [28 %] 28 % (02/21 1206) Weight:  [56.3 kg (124 lb 3.2 oz)] 56.3 kg (124 lb 3.2 oz) (02/21 1158)  Intake/Output from previous day: 02/21 0701 - 02/22 0700 In: 4281.3 [P.O.:1000; I.V.:3181.3; IV Piggyback:100] Out: 6440 [Urine:3930; Blood:25] Intake/Output this shift: Total I/O In: 537.5 [P.O.:360; I.V.:177.5] Out: 450 [Urine:450]  Recent Labs    03/29/17 1305 03/30/17 0432  HGB 12.2* 10.7*   Recent Labs    03/29/17 1305 03/30/17 0432  WBC 9.5 8.5  RBC 3.95* 3.43*  HCT 36.4* 31.4*  PLT 173 152   Recent Labs    03/29/17 1305 03/30/17 0432  NA  --  138  K  --  4.2  CL  --  109  CO2  --  23  BUN  --  14  CREATININE 0.77 0.78  GLUCOSE  --  173*  CALCIUM  --  8.4*   No results for input(s): LABPT, INR in the last 72 hours.  EXAM General - Patient is Alert, Appropriate and Oriented Extremity - Neurovascular intact Sensation intact distally Intact pulses distally Dorsiflexion/Plantar flexion intact No cellulitis present Compartment soft mild swelling right mid thigh, no tightness. compartment soft Dressing - dressing C/D/I and no drainage Motor Function - intact, moving foot and toes well on exam   Past Medical History:  Diagnosis Date  . Anemia   . Arthritis    Osteoarthritis  . Coronary artery disease   . Full dentures    upper and lower  . GERD (gastroesophageal reflux disease)   . Hyperlipidemia    . Hypertension     Assessment/Plan:   1 Day Post-Op Procedure(s) (LRB): TOTAL KNEE ARTHROPLASTY (Right) Active Problems:   Primary localized osteoarthritis of right knee   Acute post op blood loss anemia with underlying chronic anemia  Estimated body mass index is 18.88 kg/m as calculated from the following:   Height as of this encounter: 5\' 8"  (1.727 m).   Weight as of this encounter: 56.3 kg (124 lb 3.2 oz). Advance diet Up with therapy  Needs BM Acute post op blood loss anemia with underlying chronic anemia - Hgb down to 10.7. Continue with Iron and recheck labs in the am CM to assist with discharge to home with HHPT   DVT Prophylaxis - Lovenox, Foot Pumps and TED hose Weight-Bearing as tolerated to right leg   T. Rachelle Hora, PA-C [place] 03/30/2017, 11:31 AM

## 2017-03-30 NOTE — Progress Notes (Signed)
Physical Therapy Treatment Patient Details Name: Donald [name] MRN: [id number] DOB: 08/26/1945 Today's Date: 03/30/2017    History of Present Illness admitted for acute hospitalization status post R TKR (03/29/17), WBAT.    PT Comments    Patient continues to report increased pain in R knee (compared to previous TKR in December), but exerts excellent effort with all thearapeutic tasks, reporting mild reduction in pain with therex/mobility.  Able to increase gait distance (90') with RW, cga/close sup-slow and guarded, but improving throughout distance.  Continues to require cuing for R TKE in all positions; will continue to address in subsequent sessions.    Follow Up Recommendations  Home health PT     Equipment Recommendations  Rolling walker with 5" wheels    Recommendations for Other Services       Precautions / Restrictions Precautions Precautions: Fall Restrictions Weight Bearing Restrictions: Yes RLE Weight Bearing: Weight bearing as tolerated    Mobility  Bed Mobility Overal bed mobility: Modified Independent                Transfers Overall transfer level: Needs assistance Equipment used: Rolling walker (2 wheeled) Transfers: Sit to/from Stand Sit to Stand: Supervision;Min guard         General transfer comment: cuing for hand placement; maintains R LE anterior to BOS for pain control  Ambulation/Gait Ambulation/Gait assistance: Supervision Ambulation Distance (Feet): 90 Feet Assistive device: Rolling walker (2 wheeled)       General Gait Details: step to progressing to step through gait pattern; slow and guarded, but improved with cuing for increased cadence.  Mild forward trunk flexion; cuing for postural extensiona nd R TKE in loading phases of gait   Stairs            Wheelchair Mobility    Modified Rankin (Stroke Patients Only)       Balance Overall balance assessment: Needs assistance Sitting-balance support: No upper  extremity supported;Feet supported Sitting balance-Leahy Scale: Good     Standing balance support: Bilateral upper extremity supported Standing balance-Leahy Scale: Fair                              Cognition Arousal/Alertness: Awake/alert Behavior During Therapy: WFL for tasks assessed/performed Overall Cognitive Status: Within Functional Limits for tasks assessed                                        Exercises Total Joint Exercises Goniometric ROM:  R knee, 8-82 degrees Other Exercises Other Exercises: Seated R LE therex, emphasis on R knee flex/ext ROM.      General Comments        Pertinent Vitals/Pain Pain Assessment: No/denies pain Pain Score: 7  Pain Location: R knee Pain Descriptors / Indicators: Aching;Grimacing;Guarding Pain Intervention(s): Limited activity within patient's tolerance;Monitored during session;Premedicated before session;Repositioned    Home Living                      Prior Function            PT Goals (current goals can now be found in the care plan section) Acute Rehab PT Goals Patient Stated Goal: to return home PT Goal Formulation: With patient/family Time For Goal Achievement: 04/12/17 Potential to Achieve Goals: Good Progress towards PT goals: Progressing toward goals    Frequency  BID      PT Plan Current plan remains appropriate    Co-evaluation              AM-PAC PT "6 Clicks" Daily Activity  Outcome Measure  Difficulty turning over in bed (including adjusting bedclothes, sheets and blankets)?: A Little Difficulty moving from lying on back to sitting on the side of the bed? : A Little Difficulty sitting down on and standing up from a chair with arms (e.g., wheelchair, bedside commode, etc,.)?: A Little Help needed moving to and from a bed to chair (including a wheelchair)?: A Little Help needed walking in hospital room?: A Little Help needed climbing 3-5 steps with a  railing? : A Lot 6 Click Score: 17    End of Session Equipment Utilized During Treatment: Gait belt Activity Tolerance: Patient tolerated treatment well Patient left: in chair;with chair alarm set;with call bell/phone within reach Nurse Communication: Mobility status PT Visit Diagnosis: Difficulty in walking, not elsewhere classified (R26.2);Pain Pain - Right/Left: Right Pain - part of body: Knee     Time: 0623-7628 PT Time Calculation (min) (ACUTE ONLY): 33 min  Charges:  $Gait Training: 8-22 mins $Therapeutic Exercise: 8-22 mins                    G Codes:      Ileigh Mettler H. Owens Shark, PT, DPT, NCS 03/30/17, 11:32 AM [phone]

## 2017-03-31 ENCOUNTER — Encounter: Payer: Self-pay | Admitting: Orthopedic Surgery

## 2017-03-31 LAB — CBC
HEMATOCRIT: 31 % — AB (ref 40.0–52.0)
HEMOGLOBIN: 10.8 g/dL — AB (ref 13.0–18.0)
MCH: 31.8 pg (ref 26.0–34.0)
MCHC: 35 g/dL (ref 32.0–36.0)
MCV: 90.9 fL (ref 80.0–100.0)
Platelets: 132 10*3/uL — ABNORMAL LOW (ref 150–440)
RBC: 3.41 MIL/uL — AB (ref 4.40–5.90)
RDW: 13.8 % (ref 11.5–14.5)
WBC: 8.9 10*3/uL (ref 3.8–10.6)

## 2017-03-31 LAB — BASIC METABOLIC PANEL
Anion gap: 7 (ref 5–15)
BUN: 11 mg/dL (ref 6–20)
CHLORIDE: 100 mmol/L — AB (ref 101–111)
CO2: 23 mmol/L (ref 22–32)
Calcium: 8.5 mg/dL — ABNORMAL LOW (ref 8.9–10.3)
Creatinine, Ser: 0.61 mg/dL (ref 0.61–1.24)
GFR calc Af Amer: >60 mL/min (ref >60)
GFR calc non Af Amer: >60 mL/min (ref >60)
GLUCOSE: 152 mg/dL — AB (ref 65–99)
POTASSIUM: 4.2 mmol/L (ref 3.5–5.1)
Sodium: 130 mmol/L — ABNORMAL LOW (ref 135–145)

## 2017-03-31 LAB — SODIUM: Sodium: 131 mmol/L — ABNORMAL LOW (ref 135–145)

## 2017-03-31 MED ORDER — TRAMADOL HCL 50 MG PO TABS: 50.0000 mg | ORAL_TABLET | Freq: Four times a day (QID) | ORAL | Status: DC | PRN

## 2017-03-31 NOTE — Anesthesia Postprocedure Evaluation (Signed)
Anesthesia Post Note  Patient: Donald [name]  Procedure(s) Performed: TOTAL KNEE ARTHROPLASTY (Right )  Anesthesia Type: General     Last Vitals:  Vitals:   03/31/17 0332 03/31/17 0732  BP: (!) 136/101 (!) 147/55  Pulse: 91 87  Resp:  16  Temp: 37.1 C 36.9 C  SpO2: 96% 98%    Last Pain:  Vitals:   03/31/17 0919  TempSrc:   PainSc: 7                  Romaine Maciolek S

## 2017-03-31 NOTE — Progress Notes (Signed)
Subjective: 2 Days Post-Op Procedure(s) (LRB): TOTAL KNEE ARTHROPLASTY (Right) Patient reports pain as moderate, reports his pain as a 9 out of 10..   Patient is well, and has had no acute complaints or problems Denies any CP, SOB, ABD pain. Denies any urinary symptoms. We will continue therapy today.  Plan is to go Home after hospital stay.  Objective: Vital signs in last 24 hours: Temp:  [98.2 F (36.8 C)-99.6 F (37.6 C)] 98.4 F (36.9 C) (02/23 0732) Pulse Rate:  [67-92] 87 (02/23 0732) Resp:  [16-18] 16 (02/23 0732) BP: (136-167)/(55-101) 147/55 (02/23 0732) SpO2:  [95 %-100 %] 98 % (02/23 0732)  Intake/Output from previous day: 02/22 0701 - 02/23 0700 In: 2037.5 [P.O.:1560; I.V.:477.5] Out: 2325 [Urine:2325] Intake/Output this shift: No intake/output data recorded.  Recent Labs    03/29/17 1305 03/30/17 0432 03/31/17 0430  HGB 12.2* 10.7* 10.8*   Recent Labs    03/30/17 0432 03/31/17 0430  WBC 8.5 8.9  RBC 3.43* 3.41*  HCT 31.4* 31.0*  PLT 152 132*   Recent Labs    03/30/17 0432 03/31/17 0430  NA 138 130*  K 4.2 4.2  CL 109 100*  CO2 23 23  BUN 14 11  CREATININE 0.78 0.61  GLUCOSE 173* 152*  CALCIUM 8.4* 8.5*   No results for input(s): LABPT, INR in the last 72 hours.  EXAM General - Patient is Alert, Appropriate and Oriented Extremity - Neurovascular intact Sensation intact distally Intact pulses distally Dorsiflexion/Plantar flexion intact No cellulitis present Compartment soft mild swelling right mid thigh, no tightness. compartment soft Dressing - Bulky dressing removed today and honeycomb dressing applied, no signs of infection. Motor Function - intact, moving foot and toes well on exam.  Able to perform a straight leg raise with moderate assistance.  Quad tendon is palpable.  Past Medical History:  Diagnosis Date  . Anemia   . Arthritis    Osteoarthritis  . Coronary artery disease   . Full dentures    upper and lower  . GERD  (gastroesophageal reflux disease)   . Hyperlipidemia   . Hypertension     Assessment/Plan:   2 Days Post-Op Procedure(s) (LRB): TOTAL KNEE ARTHROPLASTY (Right) Active Problems:   Primary localized osteoarthritis of right knee   Acute post op blood loss anemia with underlying chronic anemia  Estimated body mass index is 18.88 kg/m as calculated from the following:   Height as of this encounter: 5\' 8"  (1.727 m).   Weight as of this encounter: 56.3 kg (124 lb 3.2 oz). Advance diet Up with therapy   Labs reviewed this morning. Na 130, will reorder to ensure accuracy. Mild fevers last night, WBC 8.9 today, encouraged incentive spirometer. Pain remains an issue, added tramadol today. Plan will be for discharge home with HHPT tomorrow pending progress with PT.  DVT Prophylaxis - Lovenox, Foot Pumps and TED hose Weight-Bearing as tolerated to right leg  J. Cameron Proud, PA-C [place] 03/31/2017, 9:00 AM

## 2017-03-31 NOTE — Anesthesia Postprocedure Evaluation (Signed)
Anesthesia Post Note  Patient: Donald [name]  Procedure(s) Performed: TOTAL KNEE ARTHROPLASTY (Right )  Patient location during evaluation: PACU Anesthesia Type: General and Regional Level of consciousness: awake and alert Pain management: pain level controlled Vital Signs Assessment: post-procedure vital signs reviewed and stable Respiratory status: spontaneous breathing, nonlabored ventilation, respiratory function stable and patient connected to nasal cannula oxygen Cardiovascular status: blood pressure returned to baseline and stable Postop Assessment: no apparent nausea or vomiting Anesthetic complications: no     Last Vitals:  Vitals:   03/31/17 0332 03/31/17 0732  BP: (!) 136/101 (!) 147/55  Pulse: 91 87  Resp:  16  Temp: 37.1 C 36.9 C  SpO2: 96% 98%    Last Pain:  Vitals:   03/31/17 0919  TempSrc:   PainSc: 7                  Almyra Birman S

## 2017-03-31 NOTE — Progress Notes (Signed)
Physical Therapy Treatment Patient Details Name: Donald [name] MRN: [id number] DOB: 01/11/1946 Today's Date: 03/31/2017    History of Present Illness admitted for acute hospitalization status post R TKR (03/29/17), WBAT.    PT Comments    Upon entering room pt is laying in bed. Currently reports pain at 7/10 today. He was able to perform supine exercise for knee flexion. Required MINA with RLE control with bed mobility. He improved ROM 8 - 86 degrees today. Performed pre-gait activities prior to walking which he reported felt like it helped. He amb ~20 ft today and return to the recliner where he was able to perform exercises. Pt was left in the recliner post session with the nurse notified.    Follow Up Recommendations        Equipment Recommendations       Recommendations for Other Services       Precautions / Restrictions Restrictions RLE Weight Bearing: Weight bearing as tolerated    Mobility  Bed Mobility Overal bed mobility: Modified Independent Bed Mobility: Supine to Sit     Supine to sit: Min assist     General bed mobility comments: for R LE management  Transfers Overall transfer level: Needs assistance Equipment used: Rolling walker (2 wheeled)                Ambulation/Gait Ambulation/Gait assistance: Supervision Ambulation Distance (Feet): 20 Feet Assistive device: Rolling walker (2 wheeled) Gait Pattern/deviations: Step-to pattern;Decreased stride length;Shuffle;Trunk flexed     General Gait Details: slow guarded gait, progessed stepto pattern to slight step through.    Stairs            Wheelchair Mobility    Modified Rankin (Stroke Patients Only)       Balance                                            Cognition Arousal/Alertness: Awake/alert Behavior During Therapy: WFL for tasks assessed/performed Overall Cognitive Status: Within Functional Limits for tasks assessed                                        Exercises Total Joint Exercises Goniometric ROM: R knee 8 - 86 Other Exercises Other Exercises: supine heel slides 1 x 10, seated ankle pumps 2 x 10, standing glute set 1 x 10 holding 2 sec,,  Other Exercises: pre-gait wight shiftin L<>R 1 x 10, shifting forward/ back with RLE in front 1 x 10, 1 x 10 forward weight shifting with active DF for activation of quad    General Comments        Pertinent Vitals/Pain Pain Assessment: 0-10 Pain Score: 7  Pain Location: R knee Pain Descriptors / Indicators: Aching;Grimacing;Guarding Pain Intervention(s): Monitored during session;Limited activity within patient's tolerance    Home Living                      Prior Function            PT Goals (current goals can now be found in the care plan section) Acute Rehab PT Goals Patient Stated Goal: to return home Progress towards PT goals: Progressing toward goals    Frequency    BID      PT Plan Current plan remains appropriate  Co-evaluation              AM-PAC PT "6 Clicks" Daily Activity  Outcome Measure  Difficulty turning over in bed (including adjusting bedclothes, sheets and blankets)?: A Little Difficulty moving from lying on back to sitting on the side of the bed? : A Little Difficulty sitting down on and standing up from a chair with arms (e.g., wheelchair, bedside commode, etc,.)?: A Little Help needed moving to and from a bed to chair (including a wheelchair)?: A Little Help needed walking in hospital room?: A Little Help needed climbing 3-5 steps with a railing? : A Lot 6 Click Score: 17    End of Session Equipment Utilized During Treatment: Gait belt Activity Tolerance: Patient tolerated treatment well Patient left: in chair;with chair alarm set;with call bell/phone within reach Nurse Communication: Mobility status PT Visit Diagnosis: Difficulty in walking, not elsewhere classified (R26.2);Pain Pain - Right/Left: Right Pain  - part of body: Knee     Time: 4103-0131 PT Time Calculation (min) (ACUTE ONLY): 43 min  Charges:  $Gait Training: 8-22 mins $Therapeutic Exercise: 23-37 mins                    G Codes:       Lovie Agresta PT, DPT, LAT, ATC  03/31/17  1:05 PM        Destyni Hoppel 03/31/2017, 1:02 PM

## 2017-03-31 NOTE — Plan of Care (Signed)
  Education: Knowledge of General Education information will improve 03/31/2017 0556 - Progressing by Gino Garrabrant, Lucille Passy, RN   Health Behavior/Discharge Planning: Ability to manage health-related needs will improve 03/31/2017 0556 - Progressing by Giovonni Poirier, Lucille Passy, RN   Clinical Measurements: Ability to maintain clinical measurements within normal limits will improve 03/31/2017 0556 - Progressing by Jeremy Mclamb, Lucille Passy, RN Will remain free from infection 03/31/2017 0556 - Progressing by Kadeisha Betsch, Lucille Passy, RN Diagnostic test results will improve 03/31/2017 [phone] - Progressing by Eileen Croswell, Lucille Passy, RN Respiratory complications will improve 03/31/2017 0556 - Progressing by Bryna Colander, RN Cardiovascular complication will be avoided 03/31/2017 0556 - Progressing by Bryna Colander, RN   Activity: Risk for activity intolerance will decrease 03/31/2017 0556 - Progressing by Bryna Colander, RN   Nutrition: Adequate nutrition will be maintained 03/31/2017 0556 - Progressing by Bryna Colander, RN   Coping: Level of anxiety will decrease 03/31/2017 0556 - Progressing by Bryna Colander, RN   Elimination: Will not experience complications related to bowel motility 03/31/2017 0556 - Progressing by Bryna Colander, RN Will not experience complications related to urinary retention 03/31/2017 0556 - Progressing by Tifanny Dollens, Lucille Passy, RN

## 2017-04-01 LAB — CBC WITH DIFFERENTIAL/PLATELET
BASOS PCT: 0 %
Basophils Absolute: 0 10*3/uL (ref 0–0.1)
EOS PCT: 0 %
Eosinophils Absolute: 0 10*3/uL (ref 0–0.7)
HCT: 30.1 % — ABNORMAL LOW (ref 40.0–52.0)
HEMOGLOBIN: 10.2 g/dL — AB (ref 13.0–18.0)
Lymphocytes Relative: 10 %
Lymphs Abs: 0.9 10*3/uL — ABNORMAL LOW (ref 1.0–3.6)
MCH: 31.2 pg (ref 26.0–34.0)
MCHC: 33.9 g/dL (ref 32.0–36.0)
MCV: 91.9 fL (ref 80.0–100.0)
MONO ABS: 1.1 10*3/uL — AB (ref 0.2–1.0)
MONOS PCT: 12 %
Neutro Abs: 7.2 10*3/uL — ABNORMAL HIGH (ref 1.4–6.5)
Neutrophils Relative %: 78 %
Platelets: 132 10*3/uL — ABNORMAL LOW (ref 150–440)
RBC: 3.28 MIL/uL — ABNORMAL LOW (ref 4.40–5.90)
RDW: 14 % (ref 11.5–14.5)
WBC: 9.2 10*3/uL (ref 3.8–10.6)

## 2017-04-01 LAB — TYPE AND SCREEN
ABO/RH(D): B NEG
Antibody Screen: NEGATIVE
UNIT DIVISION: 0
UNIT DIVISION: 0

## 2017-04-01 LAB — BASIC METABOLIC PANEL
Anion gap: 7 (ref 5–15)
BUN: 17 mg/dL (ref 6–20)
CALCIUM: 8.5 mg/dL — AB (ref 8.9–10.3)
CHLORIDE: 99 mmol/L — AB (ref 101–111)
CO2: 25 mmol/L (ref 22–32)
CREATININE: 0.87 mg/dL (ref 0.61–1.24)
GFR calc Af Amer: >60 mL/min (ref >60)
GFR calc non Af Amer: >60 mL/min (ref >60)
Glucose, Bld: 112 mg/dL — ABNORMAL HIGH (ref 65–99)
Potassium: 4.5 mmol/L (ref 3.5–5.1)
SODIUM: 131 mmol/L — AB (ref 135–145)

## 2017-04-01 LAB — BPAM RBC
BLOOD PRODUCT EXPIRATION DATE: 201903222359
Blood Product Expiration Date: 201903282359
UNIT TYPE AND RH: 9500
Unit Type and Rh: 9500

## 2017-04-01 MED ORDER — FLEET ENEMA 7-19 GM/118ML RE ENEM: 1.0000 | ENEMA | Freq: Every day | RECTAL | Status: DC | PRN

## 2017-04-01 MED ORDER — ENOXAPARIN SODIUM 40 MG/0.4ML ~~LOC~~ SOLN: 40.0000 mg | SUBCUTANEOUS | 0 refills | Status: DC

## 2017-04-01 MED ORDER — OXYCODONE HCL 5 MG PO TABS: 5.0000 mg | ORAL_TABLET | ORAL | 0 refills | Status: DC | PRN

## 2017-04-01 MED ORDER — METHOCARBAMOL 500 MG PO TABS: 500.0000 mg | ORAL_TABLET | Freq: Four times a day (QID) | ORAL | 1 refills | Status: DC | PRN

## 2017-04-01 MED ORDER — TRAMADOL HCL 50 MG PO TABS: 50.0000 mg | ORAL_TABLET | Freq: Four times a day (QID) | ORAL | 0 refills | Status: DC | PRN

## 2017-04-01 NOTE — Progress Notes (Signed)
PT Cancellation Note  Patient Details Name: Donald [name] MRN: [id number] DOB: 1945/06/08   Cancelled Treatment:    Reason Eval/Treat Not Completed: Other (comment);Patient at procedure or test/unavailable. PT arrived for BID treat but patient in process of DC with RN. Pt refuses need to perform any additional mobility training prior to DC to home. HEP handout is brought to room and reviewed with wife who will be assisting patient with HEP s/p DC.   3:24 PM, 04/01/17 Etta Grandchild, PT, DPT Physical Therapist - [place] ([place])     [place] C 04/01/2017, 3:23 PM

## 2017-04-01 NOTE — Progress Notes (Signed)
Physical Therapy Treatment Patient Details Name: Donald [name] MRN: [id number] DOB: April 24, 1945 Today's Date: 04/01/2017    History of Present Illness Donald [name] is a 72yo white male who comes to [facility] for elecctive Rt TKA. Pt underwent Left TKA in December 2018, also noted to have recent Left ankle stress frature. PMH: femur fracture s/p fall.     PT Comments    Pt continues to be limited by pain this session, not only in knee but worse pain (7-8/10) in the right posterior ankle. ROM is slightly more limited this session, again limited by pain (15-73 degrees). Activation in the quads is heavily impaired, unclear if this is typical effusion mediated neurological inhibition v. other occult pathology. Consider use of sensory tens to address neurological inhibition as support by recent evidence (Pietrosimone et. al.) Tolerance to exercises is very limited at this time by pain and weakness, and pt is unable to perform independently. Empirically, gait speed (0.26m/s) is suggestive that pt would be more safe and appropriate for DC to STR/SNF, but ultimately he is not agreeable with PT recommendation this session. Today he tolerates on 198ftAMB at supervision level with RW. No clear progression toward goals at this time, although pt is very motivated and participatory. PT will continue to follow.     Follow Up Recommendations  Home health PT;Supervision for mobility/OOB(STR more appropriate)     Equipment Recommendations  Rolling walker with 5" wheels    Recommendations for Other Services       Precautions / Restrictions Precautions Precautions: Fall Restrictions Weight Bearing Restrictions: Yes RLE Weight Bearing: Weight bearing as tolerated    Mobility  Bed Mobility Overal bed mobility: Needs Assistance Bed Mobility: Supine to Sit     Supine to sit: Min assist;Mod assist     General bed mobility comments: min-modA for trunk flexion and RLE management, pivot at  EOB  Transfers Overall transfer level: Needs assistance Equipment used: Rolling walker (2 wheeled) Transfers: Sit to/from Stand Sit to Stand: Min assist         General transfer comment: Maximal effort required, elbow on RW hand on right   Ambulation/Gait Ambulation/Gait assistance: Supervision Ambulation Distance (Feet): 100 Feet Assistive device: Rolling walker (2 wheeled) Gait Pattern/deviations: Step-to pattern;Decreased stride length;Shuffle;Trunk flexed;Decreased step length - right;Antalgic Gait velocity: 0.37m/s (very slow)  Gait velocity interpretation: <1.8 ft/sec, indicative of risk for recurrent falls General Gait Details: Rt 3-point step-to gait with RW;    Stairs            Wheelchair Mobility    Modified Rankin (Stroke Patients Only)       Balance Overall balance assessment: Needs assistance Sitting-balance support: No upper extremity supported;Feet supported Sitting balance-Leahy Scale: Good     Standing balance support: Bilateral upper extremity supported;During functional activity Standing balance-Leahy Scale: Fair                              Cognition Arousal/Alertness: Awake/alert Behavior During Therapy: WFL for tasks assessed/performed Overall Cognitive Status: Within Functional Limits for tasks assessed                                        Exercises Total Joint Exercises Ankle Circles/Pumps: AROM;15 reps;Both;Supine Short Arc Quad: AAROM;Supine;Right;15 reps Heel Slides: AAROM;Supine;Right;15 reps Goniometric ROM: Rt knee P/ROM: 15-73 degree (8-86 degrees 1DA)  General Comments        Pertinent Vitals/Pain Pain Assessment: 0-10 Pain Score: 6  Pain Location: Rt knee: 6/10; Right posterior ankle (near achilles): 8/10  Pain Descriptors / Indicators: Aching;Grimacing;Guarding Pain Intervention(s): Limited activity within patient's tolerance;Monitored during session;Premedicated before session     Home Living                      Prior Function            PT Goals (current goals can now be found in the care plan section) Acute Rehab PT Goals Patient Stated Goal: to return home PT Goal Formulation: With patient/family Time For Goal Achievement: 04/12/17 Potential to Achieve Goals: Good Progress towards PT goals: Progressing toward goals    Frequency    BID      PT Plan Current plan remains appropriate(Pt empirically is better suited for DC to STR at this time, but is not agreeable when discussed this session. )    Co-evaluation              AM-PAC PT "6 Clicks" Daily Activity  Outcome Measure  Difficulty turning over in bed (including adjusting bedclothes, sheets and blankets)?: Unable Difficulty moving from lying on back to sitting on the side of the bed? : Unable Difficulty sitting down on and standing up from a chair with arms (e.g., wheelchair, bedside commode, etc,.)?: A Lot Help needed moving to and from a bed to chair (including a wheelchair)?: A Lot Help needed walking in hospital room?: A Lot Help needed climbing 3-5 steps with a railing? : A Lot 6 Click Score: 10    End of Session Equipment Utilized During Treatment: Gait belt Activity Tolerance: Patient tolerated treatment well;No increased pain;Patient limited by pain Patient left: in chair;with chair alarm set;with call bell/phone within reach Nurse Communication: Mobility status PT Visit Diagnosis: Difficulty in walking, not elsewhere classified (R26.2);Pain Pain - Right/Left: Right Pain - part of body: Knee     Time: 7412-8786 PT Time Calculation (min) (ACUTE ONLY): 40 min  Charges:  $Gait Training: 8-22 mins $Therapeutic Activity: 23-37 mins                    G Codes:       11:25 AM, 2017-04-17 Etta Grandchild, PT, DPT Physical Therapist - [place] [phone] ([place])     Caniyah Murley C April 17, 2017, 11:17 AM

## 2017-04-01 NOTE — Discharge Summary (Signed)
Physician Discharge Summary  Patient ID: Donald [name] MRN: [id number] DOB/AGE: [birth year] 72 y.o.  Admit date: 03/29/2017 Discharge date: 04/01/2017  Admission Diagnoses:  PRIMARY OSTEOARTHRITIS OF RIGHT KNEE  Discharge Diagnoses: Patient Active Problem List   Diagnosis Date Noted  . Primary localized osteoarthritis of right knee 03/29/2017  . Protein-calorie malnutrition, severe 01/25/2017  . Primary osteoarthritis of left knee 01/23/2017  . Hypertension 07/14/2014  . GERD (gastroesophageal reflux disease) 07/14/2014  . Hyperlipidemia 07/14/2014  . Osteoarthritis 07/14/2014  . Gastric ulcer 07/14/2014  . Abdominal pain, epigastric 07/14/2014  Primary osteoarthritis of the right knee.  Past Medical History:  Diagnosis Date  . Anemia   . Arthritis    Osteoarthritis  . Coronary artery disease   . Full dentures    upper and lower  . GERD (gastroesophageal reflux disease)   . Hyperlipidemia   . Hypertension      Transfusion: None.   Consultants (if any):   Discharged Condition: Improved  Hospital Course: TALLIS [name] is an 72 y.o. male who was admitted 03/29/2017 with a diagnosis of primary osteoarthritis of the right knee and went to the operating room on 03/29/2017 and underwent the above named procedures.    Surgeries: Procedure(s): TOTAL KNEE ARTHROPLASTY on 03/29/2017 Patient tolerated the surgery well. Taken to PACU where she was stabilized and then transferred to the orthopedic floor.  Started on Lovenox 30mg  q 12 hrs. Foot pumps applied bilaterally at 80 mm. Heels elevated on bed with rolled towels. No evidence of DVT. Negative [place]oman. Physical therapy started on day #1 for gait training and transfer. OT started day #1 for ADL and assisted devices.  Patient's IV was removed on POD1.  Foley removed on POD1.  Implants: Medacta GMK sphere 3 femur, 3 tibia with short stem 31mm, 3patella all components cemented  He was given perioperative antibiotics:   Anti-infectives (From admission, onward)   Start     Dose/Rate Route Frequency Ordered Stop   03/29/17 1330  ceFAZolin (ANCEF) IVPB 2g/100 mL premix     2 g 200 mL/hr over 30 Minutes Intravenous Every 6 hours 03/29/17 1205 03/29/17 2045   03/29/17 0603  ceFAZolin (ANCEF) 2-4 GM/100ML-% IVPB    Comments:  Phineas Real   : cabinet override      03/29/17 0603 03/29/17 0730   03/28/17 2215  ceFAZolin (ANCEF) IVPB 2g/100 mL premix     2 g 200 mL/hr over 30 Minutes Intravenous  Once 03/28/17 2213 03/29/17 0740    .  He was given sequential compression devices, early ambulation, and Lovenox for DVT prophylaxis.  He benefited maximally from the hospital stay and there were no complications.    Recent vital signs:  Vitals:   04/01/17 0322 04/01/17 0709  BP: (!) 99/47 (!) 103/54  Pulse: 94 97  Resp: 18 18  Temp: 98.2 F (36.8 C) 98.8 F (37.1 C)  SpO2: 95% 94%    Recent laboratory studies:  Lab Results  Component Value Date   HGB 10.2 (L) 04/01/2017   HGB 10.8 (L) 03/31/2017   HGB 10.7 (L) 03/30/2017   Lab Results  Component Value Date   WBC 9.2 04/01/2017   PLT 132 (L) 04/01/2017   Lab Results  Component Value Date   INR 1.01 03/16/2017   Lab Results  Component Value Date   NA 131 (L) 03/31/2017   K 4.2 03/31/2017   CL 100 (L) 03/31/2017   CO2 23 03/31/2017   BUN 11 03/31/2017  CREATININE 0.61 03/31/2017   GLUCOSE 152 (H) 03/31/2017    Discharge Medications:   Allergies as of 04/01/2017      Reactions   Ciprofloxacin Nausea Only   Flagyl [metronidazole] Nausea Only      Medication List    STOP taking these medications   HYDROcodone-acetaminophen 5-325 MG tablet Commonly known as:  NORCO/VICODIN     TAKE these medications   alfuzosin 10 MG 24 hr tablet Commonly known as:  UROXATRAL Take 10 mg by mouth at bedtime.   amLODipine 10 MG tablet Commonly known as:  NORVASC Take 5 mg by mouth daily.   aspirin EC 81 MG tablet Take 81 mg by mouth  daily.   atorvastatin 80 MG tablet Commonly known as:  LIPITOR Take 80 mg by mouth every evening.   calcium carbonate 1500 (600 Ca) MG Tabs tablet Commonly known as:  OSCAL Take 600 mg by mouth daily.   carvedilol 3.125 MG tablet Commonly known as:  COREG Take 3.125 mg by mouth 2 (two) times daily with a meal. Morning & afternoon   clonazePAM 1 MG tablet Commonly known as:  KLONOPIN Take 1 mg by mouth at bedtime.   diclofenac 75 MG EC tablet Commonly known as:  VOLTAREN Take 75 mg by mouth 2 (two) times daily.   docusate sodium 50 MG capsule Commonly known as:  COLACE Take 1 capsule (50 mg total) by mouth 2 (two) times daily. What changed:    when to take this  reasons to take this   enoxaparin 40 MG/0.4ML injection Commonly known as:  LOVENOX Inject 0.4 mLs (40 mg total) into the skin daily.   ferrous sulfate 325 (65 FE) MG tablet Take 1 tablet (325 mg total) by mouth daily with breakfast.   Fish Oil 1200 MG Cpdr Take 1,200 mg by mouth daily.   lisinopril 20 MG tablet Commonly known as:  PRINIVIL,ZESTRIL Take 10 mg by mouth daily.   methocarbamol 500 MG tablet Commonly known as:  ROBAXIN Take 1 tablet (500 mg total) by mouth every 6 (six) hours as needed for muscle spasms.   oxyCODONE 5 MG immediate release tablet Commonly known as:  Oxy IR/ROXICODONE Take 1-2 tablets (5-10 mg total) by mouth every 4 (four) hours as needed for moderate pain.   pantoprazole 40 MG tablet Commonly known as:  PROTONIX Take 40 mg by mouth 2 (two) times daily.   traMADol 50 MG tablet Commonly known as:  ULTRAM Take 1-2 tablets (50-100 mg total) by mouth every 6 (six) hours as needed for severe pain.   vitamin C 500 MG tablet Commonly known as:  ASCORBIC ACID Take 500 mg by mouth daily.       Diagnostic Studies: Dg Knee 1-2 Views Right  Result Date: 03/29/2017 CLINICAL DATA:  Followup knee arthroplasty. EXAM: RIGHT KNEE - 1-2 VIEW COMPARISON:  11/03/2016 FINDINGS: Total  knee arthroplasty with components grossly well positioned. Air in the joint as expected. No radiographically visible complication. IMPRESSION: Good appearance following total knee replacement. Electronically Signed   By: Nelson Chimes M.D.   On: 03/29/2017 09:59   Disposition: Home with HHPT this afternoon.  Follow-up Information    Hessie Knows, MD Follow up in 14 day(s).   Specialty:  Orthopedic Surgery Why:  [employer] Rem[employer]. Contact information: [place] [address] [phone]          Signed: Judson Roch PA-C 04/01/2017, 9:00 AM

## 2017-04-01 NOTE — Progress Notes (Signed)
Patient had suppository with no relief. Refuses enema. Patient still needs BM postoperatively, [place] PA paged. Waiting on callback.

## 2017-04-01 NOTE — Care Management Note (Signed)
Case Management Note  Patient Details  Name: Donald [name] MRN: [id number] Date of Birth: July 20, 1945  Subjective/Objective:   A referral for HH=PT, RN was called to [place] at [facility].  [name] has a RW at home. Malachy Mood at [employer] reports that the [facility] to be [name] home health provider.                Action/Plan:   Expected Discharge Date:  04/01/17               Expected Discharge Plan:  [place]  In-House Referral:     Discharge planning Services  CM Consult  Post Acute Care Choice:  Home Health Choice offered to:  Patient  DME Arranged:    DME Agency:     HH Arranged:  RN, PT HH Agency:  [facility]  Status of Service:  Completed, signed off  If discussed at [place] of Stay Meetings, dates discussed:    Additional Comments:  Yazmeen Woolf A, RN 04/01/2017, 9:18 AM

## 2017-04-01 NOTE — Progress Notes (Signed)
Patient had a BM. Patient states he does not want lovenox as it is expensive and he is a patient of the [place], [place] PA notified and states it is ok for patient to start aspirin 325 twice a day at home. Patient educated. IV removed. Plan to d/c home, transportation via family at bedside.

## 2017-04-01 NOTE — Discharge Instructions (Signed)
Femoral Nerve Block Discharge Instructions   1.  For your surgery you have received a femoral Nerve Block.  2.  Your Nerve Block is expected to last for about 4 to 12 hours. This is an estimated        time frame; the results of your nerve block may wear off sooner or may last longer.  3.  If needed, your surgeon will give you a prescription for pain medication. It will take     about 60 minutes for the oral pain medication to become fully effective. So, it is     recommended that you start taking this medication before the nerve block first begins     to wear off, or when you first begin to feel discomfort.  4.  Keep in mind that nerve blocks often wear off in the middle of the night. If you are      going to bed and the block has not started to wear off or you have not started to have      any discomfort, consider setting an alarm for 2 to 3 hours, so you can assess your      block. If you notice the block is wearing off or you are starting to have discomfort,           you can take your pain medication.  5. Take your pain medication only as prescribed. Pain medication can cause sedation          and decrease your breathing if you take more than you need for the level of pain that        you have.  6.  Nausea is a common side effect of many pain medications. You may want to eat             something before taking your pain medicine to prevent nausea.  7.  After a Femoral nerve block, you cannot feel pain, pressure or extremes in                   temperature in the effected leg. Because your leg is numb it is at an increased risk for     injury. To decrease the possibility of injury, please practice the following:   a. While you are awake change the position of your leg frequently to prevent too      much pressure on any one area for prolonged periods of time.   b. If you have a cast or tight dressing, check the color or your toes every couple                of hours. Call your surgeon with the  appearance of any discoloration (white or                blue).   c. You may have difficulty bearing weight on the effected leg. Have someone                assist you with walking until the nerve block has completely worn off.   d. If you surgeon prescribed a brace to be worn after surgery, DO NOT GET UP                AT NIGHT WITHOUT YOUR BRACE.              e. If your surgeon has restricted the amount of weight you should bear on the  effected leg, i.e. No Weight, Partial Weight, or Touch Down Only, DO NOT                       BEAR MORE WEIGHT THAN INSTRUCTED.             f. If you experience any problems or concerns, please contact your surgeons                     office.    Diet: As you were doing prior to hospitalization   Shower:  May shower but keep the wounds dry, use an occlusive plastic wrap, NO SOAKING IN TUB.  If the bandage gets wet, change with a clean dry gauze.  Dressing:  You may change your dressing as needed. Change the dressing with sterile gauze dressing.    Activity:  Increase activity slowly as tolerated, but follow the weight bearing instructions below.  No lifting or driving for 6 weeks.  Weight Bearing:   Weight bearing as tolerated to right lower extremity  Blood Clot Prevention: Inject the Lovenox 40mg  daily for 14 days.  To prevent constipation: you may use a stool softener such as -  Colace (over the counter) 100 mg by mouth twice a day  Drink plenty of fluids (prune juice may be helpful) and high fiber foods Miralax (over the counter) for constipation as needed.    Itching:  If you experience itching with your medications, try taking only a single pain pill, or even half a pain pill at a time.  You may take up to 10 pain pills per day, and you can also use benadryl over the counter for itching or also to help with sleep.   Precautions:  If you experience chest pain or shortness of breath - call 911 immediately for transfer to the  hospital emergency department!!  If you develop a fever greater that 101 F, purulent drainage from wound, increased redness or drainage from wound, or calf pain-Call [place]                                              Follow- Up Appointment:  Please call for an appointment to be seen in 2 weeks at [facility]

## 2017-04-01 NOTE — Progress Notes (Signed)
Subjective: 3 Days Post-Op Procedure(s) (LRB): TOTAL KNEE ARTHROPLASTY (Right) Patient reports pain as 5 out of 10, improved compared to yesterday.   Patient is well, and has had no acute complaints or problems Denies any CP, SOB, ABD pain. Denies any urinary symptoms. We will continue therapy today.  Plan is to go Home with HHPT later this afternoon pending progress with PT.  Objective: Vital signs in last 24 hours: Temp:  [98.2 F (36.8 C)-99 F (37.2 C)] 98.8 F (37.1 C) (02/24 0709) Pulse Rate:  [87-97] 97 (02/24 0709) Resp:  [16-18] 18 (02/24 0709) BP: (96-133)/(47-63) 103/54 (02/24 0709) SpO2:  [94 %-99 %] 94 % (02/24 0709)  Intake/Output from previous day: 02/23 0701 - 02/24 0700 In: 720 [P.O.:720] Out: 1800 [Urine:1800] Intake/Output this shift: No intake/output data recorded.  Recent Labs    03/29/17 1305 03/30/17 0432 03/31/17 0430 04/01/17 0343  HGB 12.2* 10.7* 10.8* 10.2*   Recent Labs    03/31/17 0430 04/01/17 0343  WBC 8.9 9.2  RBC 3.41* 3.28*  HCT 31.0* 30.1*  PLT 132* 132*   Recent Labs    03/30/17 0432 03/31/17 0430 03/31/17 1039  NA 138 130* 131*  K 4.2 4.2  --   CL 109 100*  --   CO2 23 23  --   BUN 14 11  --   CREATININE 0.78 0.61  --   GLUCOSE 173* 152*  --   CALCIUM 8.4* 8.5*  --    No results for input(s): LABPT, INR in the last 72 hours.  EXAM General - Patient is Alert, Appropriate and Oriented Extremity - Neurovascular intact Sensation intact distally Intact pulses distally Dorsiflexion/Plantar flexion intact No cellulitis present Compartment soft improved swelling to the right thigh, minimally tender to the quad tendon Dressing - Honeycomb dressing in place with scant bloody drainage. Motor Function - intact, moving foot and toes well on exam.  Able to perform a straight leg raise, improved compared to yesterday.  Past Medical History:  Diagnosis Date  . Anemia   . Arthritis    Osteoarthritis  . Coronary artery  disease   . Full dentures    upper and lower  . GERD (gastroesophageal reflux disease)   . Hyperlipidemia   . Hypertension     Assessment/Plan:   3 Days Post-Op Procedure(s) (LRB): TOTAL KNEE ARTHROPLASTY (Right) Active Problems:   Primary localized osteoarthritis of right knee   Acute post op blood loss anemia with underlying chronic anemia  Estimated body mass index is 18.88 kg/m as calculated from the following:   Height as of this encounter: 5\' 8"  (1.727 m).   Weight as of this encounter: 56.3 kg (124 lb 3.2 oz). Advance diet Up with therapy   Labs reviewed this morning. Na 131 yesterday, patient is asymptomatic Continue incentive spirometer. Pain is improved today, continue oxycodone, tramadol and muscle relaxer. Plan will be for discharge home with HHPT today pending PT and bowel movement, move on to FLEET enema today.  DVT Prophylaxis - Lovenox, Foot Pumps and TED hose Weight-Bearing as tolerated to right leg  J. Cameron Proud, PA-C [place] 04/01/2017, 8:53 AM

## 2017-04-02 ENCOUNTER — Telehealth: Payer: Self-pay

## 2017-04-02 DIAGNOSIS — Z7982 Long term (current) use of aspirin: Secondary | ICD-10-CM | POA: Diagnosis not present

## 2017-04-02 DIAGNOSIS — E43 Unspecified severe protein-calorie malnutrition: Secondary | ICD-10-CM | POA: Diagnosis not present

## 2017-04-02 DIAGNOSIS — Z471 Aftercare following joint replacement surgery: Secondary | ICD-10-CM | POA: Diagnosis not present

## 2017-04-02 DIAGNOSIS — I2510 Atherosclerotic heart disease of native coronary artery without angina pectoris: Secondary | ICD-10-CM | POA: Diagnosis not present

## 2017-04-02 DIAGNOSIS — M069 Rheumatoid arthritis, unspecified: Secondary | ICD-10-CM | POA: Diagnosis not present

## 2017-04-02 DIAGNOSIS — M1991 Primary osteoarthritis, unspecified site: Secondary | ICD-10-CM | POA: Diagnosis not present

## 2017-04-02 DIAGNOSIS — I10 Essential (primary) hypertension: Secondary | ICD-10-CM | POA: Diagnosis not present

## 2017-04-02 DIAGNOSIS — Z96653 Presence of artificial knee joint, bilateral: Secondary | ICD-10-CM | POA: Diagnosis not present

## 2017-04-02 NOTE — Telephone Encounter (Signed)
Discharge summary reviewed and discussed with Dr. Ronnald Ramp. Because pt is followed by Dr. Rudene Christians, Dr. Ronnald Ramp states it is unnecessary to see this pt for a hosp f/u appt. Care for postop pain and f/u postop care for R total knee arthroplasty on 03/29/17 will be assumed by Dr. Rudene Christians. In light of our conversation, a TCM call was not placed.

## 2017-04-02 NOTE — Care Management (Signed)
Post discharge note entry 04/02/17- This AM there was a fax 03/30/17 at 1405 from [facility] [phone] ext [phone] with home health PT orders which have been faxed to [facility].

## 2017-04-03 DIAGNOSIS — Z471 Aftercare following joint replacement surgery: Secondary | ICD-10-CM | POA: Diagnosis not present

## 2017-04-03 DIAGNOSIS — E43 Unspecified severe protein-calorie malnutrition: Secondary | ICD-10-CM | POA: Diagnosis not present

## 2017-04-03 DIAGNOSIS — M1991 Primary osteoarthritis, unspecified site: Secondary | ICD-10-CM | POA: Diagnosis not present

## 2017-04-03 DIAGNOSIS — I2510 Atherosclerotic heart disease of native coronary artery without angina pectoris: Secondary | ICD-10-CM | POA: Diagnosis not present

## 2017-04-03 DIAGNOSIS — M069 Rheumatoid arthritis, unspecified: Secondary | ICD-10-CM | POA: Diagnosis not present

## 2017-04-03 DIAGNOSIS — I10 Essential (primary) hypertension: Secondary | ICD-10-CM | POA: Diagnosis not present

## 2017-04-05 DIAGNOSIS — Z471 Aftercare following joint replacement surgery: Secondary | ICD-10-CM | POA: Diagnosis not present

## 2017-04-05 DIAGNOSIS — I2510 Atherosclerotic heart disease of native coronary artery without angina pectoris: Secondary | ICD-10-CM | POA: Diagnosis not present

## 2017-04-05 DIAGNOSIS — E43 Unspecified severe protein-calorie malnutrition: Secondary | ICD-10-CM | POA: Diagnosis not present

## 2017-04-05 DIAGNOSIS — M1991 Primary osteoarthritis, unspecified site: Secondary | ICD-10-CM | POA: Diagnosis not present

## 2017-04-05 DIAGNOSIS — M069 Rheumatoid arthritis, unspecified: Secondary | ICD-10-CM | POA: Diagnosis not present

## 2017-04-05 DIAGNOSIS — I10 Essential (primary) hypertension: Secondary | ICD-10-CM | POA: Diagnosis not present

## 2017-04-09 DIAGNOSIS — M1991 Primary osteoarthritis, unspecified site: Secondary | ICD-10-CM | POA: Diagnosis not present

## 2017-04-09 DIAGNOSIS — E43 Unspecified severe protein-calorie malnutrition: Secondary | ICD-10-CM | POA: Diagnosis not present

## 2017-04-09 DIAGNOSIS — I2510 Atherosclerotic heart disease of native coronary artery without angina pectoris: Secondary | ICD-10-CM | POA: Diagnosis not present

## 2017-04-09 DIAGNOSIS — I10 Essential (primary) hypertension: Secondary | ICD-10-CM | POA: Diagnosis not present

## 2017-04-09 DIAGNOSIS — M069 Rheumatoid arthritis, unspecified: Secondary | ICD-10-CM | POA: Diagnosis not present

## 2017-04-09 DIAGNOSIS — Z471 Aftercare following joint replacement surgery: Secondary | ICD-10-CM | POA: Diagnosis not present

## 2017-04-10 DIAGNOSIS — I2510 Atherosclerotic heart disease of native coronary artery without angina pectoris: Secondary | ICD-10-CM | POA: Diagnosis not present

## 2017-04-10 DIAGNOSIS — M069 Rheumatoid arthritis, unspecified: Secondary | ICD-10-CM | POA: Diagnosis not present

## 2017-04-10 DIAGNOSIS — I10 Essential (primary) hypertension: Secondary | ICD-10-CM | POA: Diagnosis not present

## 2017-04-10 DIAGNOSIS — Z471 Aftercare following joint replacement surgery: Secondary | ICD-10-CM | POA: Diagnosis not present

## 2017-04-10 DIAGNOSIS — M1991 Primary osteoarthritis, unspecified site: Secondary | ICD-10-CM | POA: Diagnosis not present

## 2017-04-10 DIAGNOSIS — E43 Unspecified severe protein-calorie malnutrition: Secondary | ICD-10-CM | POA: Diagnosis not present

## 2017-04-12 DIAGNOSIS — M1991 Primary osteoarthritis, unspecified site: Secondary | ICD-10-CM | POA: Diagnosis not present

## 2017-04-12 DIAGNOSIS — I2510 Atherosclerotic heart disease of native coronary artery without angina pectoris: Secondary | ICD-10-CM | POA: Diagnosis not present

## 2017-04-12 DIAGNOSIS — M069 Rheumatoid arthritis, unspecified: Secondary | ICD-10-CM | POA: Diagnosis not present

## 2017-04-12 DIAGNOSIS — I10 Essential (primary) hypertension: Secondary | ICD-10-CM | POA: Diagnosis not present

## 2017-04-12 DIAGNOSIS — E43 Unspecified severe protein-calorie malnutrition: Secondary | ICD-10-CM | POA: Diagnosis not present

## 2017-04-12 DIAGNOSIS — Z471 Aftercare following joint replacement surgery: Secondary | ICD-10-CM | POA: Diagnosis not present

## 2017-04-27 ENCOUNTER — Ambulatory Visit (INDEPENDENT_AMBULATORY_CARE_PROVIDER_SITE_OTHER): Payer: Medicare Other | Admitting: Family Medicine

## 2017-04-27 ENCOUNTER — Other Ambulatory Visit
Admission: RE | Admit: 2017-04-27 | Discharge: 2017-04-27 | Disposition: A | Discharge status: Home / Self Care | Payer: Medicare Other | Source: Ambulatory Visit | Attending: Family Medicine | Admitting: Family Medicine

## 2017-04-27 ENCOUNTER — Encounter: Payer: Self-pay | Admitting: Family Medicine

## 2017-04-27 VITALS — BP 110/58 | HR 84 | Ht 69.0 in | Wt 114.0 lb

## 2017-04-27 DIAGNOSIS — K529 Noninfective gastroenteritis and colitis, unspecified: Secondary | ICD-10-CM | POA: Diagnosis not present

## 2017-04-27 DIAGNOSIS — R109 Unspecified abdominal pain: Secondary | ICD-10-CM | POA: Diagnosis not present

## 2017-04-27 DIAGNOSIS — E860 Dehydration: Secondary | ICD-10-CM

## 2017-04-27 DIAGNOSIS — R112 Nausea with vomiting, unspecified: Secondary | ICD-10-CM | POA: Insufficient documentation

## 2017-04-27 DIAGNOSIS — D649 Anemia, unspecified: Secondary | ICD-10-CM | POA: Insufficient documentation

## 2017-04-27 DIAGNOSIS — R634 Abnormal weight loss: Secondary | ICD-10-CM

## 2017-04-27 DIAGNOSIS — Z862 Personal history of diseases of the blood and blood-forming organs and certain disorders involving the immune mechanism: Secondary | ICD-10-CM

## 2017-04-27 LAB — CBC WITH DIFFERENTIAL/PLATELET
BASOS ABS: 0 10*3/uL (ref 0–0.1)
BASOS PCT: 1 %
EOS PCT: 1 %
Eosinophils Absolute: 0 10*3/uL (ref 0–0.7)
HCT: 35.8 % — ABNORMAL LOW (ref 40.0–52.0)
Hemoglobin: 12.2 g/dL — ABNORMAL LOW (ref 13.0–18.0)
Lymphocytes Relative: 20 %
Lymphs Abs: 0.7 10*3/uL — ABNORMAL LOW (ref 1.0–3.6)
MCH: 31.1 pg (ref 26.0–34.0)
MCHC: 34.2 g/dL (ref 32.0–36.0)
MCV: 91.1 fL (ref 80.0–100.0)
MONO ABS: 0.4 10*3/uL (ref 0.2–1.0)
Monocytes Relative: 12 %
Neutro Abs: 2.3 10*3/uL (ref 1.4–6.5)
Neutrophils Relative %: 66 %
PLATELETS: 131 10*3/uL — AB (ref 150–440)
RBC: 3.93 MIL/uL — AB (ref 4.40–5.90)
RDW: 14.9 % — AB (ref 11.5–14.5)
WBC: 3.5 10*3/uL — AB (ref 3.8–10.6)

## 2017-04-27 LAB — BASIC METABOLIC PANEL
ANION GAP: 9 (ref 5–15)
BUN: 24 mg/dL — ABNORMAL HIGH (ref 6–20)
CALCIUM: 8.6 mg/dL — AB (ref 8.9–10.3)
CO2: 23 mmol/L (ref 22–32)
Chloride: 100 mmol/L — ABNORMAL LOW (ref 101–111)
Creatinine, Ser: 1.21 mg/dL (ref 0.61–1.24)
GFR calc Af Amer: >60 mL/min (ref >60)
GFR calc non Af Amer: 58 mL/min — ABNORMAL LOW (ref >60)
GLUCOSE: 92 mg/dL (ref 65–99)
POTASSIUM: 4.2 mmol/L (ref 3.5–5.1)
Sodium: 132 mmol/L — ABNORMAL LOW (ref 135–145)

## 2017-04-27 MED ORDER — ONDANSETRON HCL 4 MG PO TABS: 4.0000 mg | ORAL_TABLET | Freq: Three times a day (TID) | ORAL | 0 refills | Status: AC | PRN

## 2017-04-27 NOTE — Progress Notes (Signed)
Name: MATHHEW BUYSSE   MRN: [id number]    DOB: Jul 23, 1945   Date:04/27/2017       Progress Note  Subjective  Chief Complaint  Chief Complaint  Patient presents with  . Nausea    going back and forth from being constipated to having diarrhea. Constipation can last for 3 days then he has a day of "going all the time"- has been "watching what I eat"    GI Problem  The primary symptoms include abdominal pain, nausea and diarrhea. Primary symptoms do not include fever, weight loss, fatigue, vomiting, melena, hematemesis, jaundice, hematochezia, dysuria, myalgias, arthralgias or rash. The illness began 3 to 5 days ago.  The abdominal pain began yesterday. The abdominal pain has been gradually worsening since its onset. The abdominal pain is located in the suprapubic region. The abdominal pain does not radiate. The severity of the abdominal pain is 7/10. The abdominal pain is relieved by being still.  Nausea began yesterday.   The illness does not include chills, anorexia, dysphagia, constipation or back pain. Significant associated medical issues include diverticulitis.    No problem-specific Assessment & Plan notes found for this encounter.   Past Medical History:  Diagnosis Date  . Anemia   . Arthritis    Osteoarthritis  . Coronary artery disease   . Full dentures    upper and lower  . GERD (gastroesophageal reflux disease)   . Hyperlipidemia   . Hypertension     Past Surgical History:  Procedure Laterality Date  . APPENDECTOMY  1979  . BACK SURGERY  [phone]  . CHOLECYSTECTOMY  2013  . COLONOSCOPY N/A 06/25/2014   Procedure: COLONOSCOPY;  Surgeon: Lucilla Lame, MD;  Location: [place];  Service: Gastroenterology;  Laterality: N/A;  cecum time- 0906  . CORONARY ANGIOPLASTY WITH STENT PLACEMENT  2006  . ESOPHAGOGASTRODUODENOSCOPY N/A 06/25/2014   Procedure: ESOPHAGOGASTRODUODENOSCOPY (EGD);  Surgeon: Lucilla Lame, MD;  Location: [place];  Service:  Gastroenterology;  Laterality: N/A;  . FLEXIBLE SIGMOIDOSCOPY  12/13/2010  . FRACTURE SURGERY Left    femur  . HERNIA REPAIR    . ORIF FEMUR FRACTURE  2012  . ROTATOR CUFF REPAIR Right   . TONSILLECTOMY    . TOTAL KNEE ARTHROPLASTY Left 01/23/2017   Procedure: TOTAL KNEE ARTHROPLASTY;  Surgeon: Hessie Knows, MD;  Location: [facility];  Service: Orthopedics;  Laterality: Left;  . TOTAL KNEE ARTHROPLASTY Right 03/29/2017   Procedure: TOTAL KNEE ARTHROPLASTY;  Surgeon: Hessie Knows, MD;  Location: [facility];  Service: Orthopedics;  Laterality: Right;  . UMBILICAL HERNIA REPAIR      History reviewed. No pertinent family history.  Social History   Socioeconomic History  . Marital status: Married    Spouse name: Not on file  . Number of children: Not on file  . Years of education: Not on file  . Highest education level: Not on file  Occupational History  . Not on file  Social Needs  . Financial resource strain: Not on file  . Food insecurity:    Worry: Not on file    Inability: Not on file  . Transportation needs:    Medical: Not on file    Non-medical: Not on file  Tobacco Use  . Smoking status: Former Smoker    Last attempt to quit: 02/06/1993    Years since quitting: 24.2  . Smokeless tobacco: Never Used  Substance and Sexual Activity  . Alcohol use: No  . Drug use: No  . Sexual  activity: Not Currently  Lifestyle  . Physical activity:    Days per week: Not on file    Minutes per session: Not on file  . Stress: Not on file  Relationships  . Social connections:    Talks on phone: Not on file    Gets together: Not on file    Attends religious service: Not on file    Active member of club or organization: Not on file    Attends meetings of clubs or organizations: Not on file    Relationship status: Not on file  . Intimate partner violence:    Fear of current or ex partner: Not on file    Emotionally abused: Not on file    Physically abused: Not on file    Forced  sexual activity: Not on file  Other Topics Concern  . Not on file  Social History Narrative  . Not on file    Allergies  Allergen Reactions  . Ciprofloxacin Nausea Only  . Flagyl [Metronidazole] Nausea Only    Outpatient Medications Prior to Visit  Medication Sig Dispense Refill  . alfuzosin (UROXATRAL) 10 MG 24 hr tablet Take 10 mg by mouth at bedtime.    Marland Kitchen amLODipine (NORVASC) 10 MG tablet Take 5 mg by mouth daily.     Marland Kitchen aspirin EC 81 MG tablet Take 81 mg by mouth daily.    Marland Kitchen atorvastatin (LIPITOR) 80 MG tablet Take 80 mg by mouth every evening.     . carvedilol (COREG) 3.125 MG tablet Take 3.125 mg by mouth 2 (two) times daily with a meal. Morning & afternoon    . clonazePAM (KLONOPIN) 1 MG tablet Take 1 mg by mouth at bedtime.    . diclofenac (VOLTAREN) 75 MG EC tablet Take 75 mg by mouth 2 (two) times daily.     Marland Kitchen docusate sodium (COLACE) 50 MG capsule Take 1 capsule (50 mg total) by mouth 2 (two) times daily. (Patient taking differently: Take 50 mg by mouth 2 (two) times daily as needed for mild constipation. ) 10 capsule 0  . ferrous sulfate 325 (65 FE) MG tablet Take 1 tablet (325 mg total) by mouth daily with breakfast. 90 tablet 3  . lisinopril (PRINIVIL,ZESTRIL) 20 MG tablet Take 10 mg by mouth daily.     . Omega-3 Fatty Acids (FISH OIL) 1200 MG CPDR Take 1,200 mg by mouth daily.     . pantoprazole (PROTONIX) 40 MG tablet Take 40 mg by mouth 2 (two) times daily.    . vitamin C (ASCORBIC ACID) 500 MG tablet Take 500 mg by mouth daily.     . methocarbamol (ROBAXIN) 500 MG tablet Take 1 tablet (500 mg total) by mouth every 6 (six) hours as needed for muscle spasms. 40 tablet 1  . calcium carbonate (OSCAL) 1500 (600 Ca) MG TABS tablet Take 600 mg by mouth daily.    Marland Kitchen enoxaparin (LOVENOX) 40 MG/0.4ML injection Inject 0.4 mLs (40 mg total) into the skin daily. 14 Syringe 0  . oxyCODONE (OXY IR/ROXICODONE) 5 MG immediate release tablet Take 1-2 tablets (5-10 mg total) by mouth  every 4 (four) hours as needed for moderate pain. 60 tablet 0  . traMADol (ULTRAM) 50 MG tablet Take 1-2 tablets (50-100 mg total) by mouth every 6 (six) hours as needed for severe pain. 40 tablet 0   No facility-administered medications prior to visit.     Review of Systems  Constitutional: Negative for chills, fatigue, fever, malaise/fatigue and weight loss.  HENT: Negative for ear discharge, ear pain and sore throat.   Eyes: Negative for blurred vision.  Respiratory: Negative for cough, sputum production, shortness of breath and wheezing.   Cardiovascular: Negative for chest pain, palpitations and leg swelling.  Gastrointestinal: Positive for abdominal pain, diarrhea and nausea. Negative for anorexia, blood in stool, constipation, dysphagia, heartburn, hematemesis, hematochezia, jaundice, melena and vomiting.  Genitourinary: Negative for dysuria, flank pain, frequency, hematuria and urgency.  Musculoskeletal: Negative for arthralgias, back pain, joint pain, myalgias and neck pain.  Skin: Negative for rash.  Neurological: Negative for dizziness, tingling, sensory change, focal weakness and headaches.  Endo/Heme/Allergies: Negative for environmental allergies and polydipsia. Does not bruise/bleed easily.  Psychiatric/Behavioral: Negative for depression and suicidal ideas. The patient is not nervous/anxious and does not have insomnia.      Objective  Vitals:   04/27/17 0856  BP: (!) 110/58  Pulse: 84  Weight: 114 lb (51.7 kg)  Height: '5\' 9"'$  (1.753 m)    Physical Exam  Constitutional: He is oriented to person, place, and time and well-developed, well-nourished, and in no distress.  HENT:  Head: Normocephalic.  Right Ear: Tympanic membrane, external ear and ear canal normal.  Left Ear: Tympanic membrane, external ear and ear canal normal.  Nose: Nose normal.  Mouth/Throat: Uvula is midline, oropharynx is clear and moist and mucous membranes are normal.  Eyes: Pupils are equal,  round, and reactive to light. Conjunctivae and EOM are normal. Right eye exhibits no discharge. Left eye exhibits no discharge. No scleral icterus.  Neck: Normal range of motion. Neck supple. No JVD present. No tracheal deviation present. No thyromegaly present.  Cardiovascular: Normal rate, regular rhythm, S1 normal, S2 normal, normal heart sounds and intact distal pulses. Exam reveals no gallop, no S3, no S4 and no friction rub.  No murmur heard. Pulmonary/Chest: Breath sounds normal. No respiratory distress. He has no wheezes. He has no rales.  Abdominal: Soft. Normal aorta and bowel sounds are normal. He exhibits no mass. There is no hepatosplenomegaly, splenomegaly or hepatomegaly. There is generalized tenderness. There is no rigidity, no rebound, no guarding and no CVA tenderness.  Genitourinary: Prostate normal. Rectal exam shows no external hemorrhoid, no mass and guaiac negative stool.  Musculoskeletal: Normal range of motion. He exhibits no edema or tenderness.  Lymphadenopathy:       Head (right side): Submandibular adenopathy present.       Head (left side): No submandibular adenopathy present.    He has no cervical adenopathy.       Right cervical: No superficial cervical adenopathy present.      Left cervical: No superficial cervical adenopathy present.  Neurological: He is alert and oriented to person, place, and time. He has normal sensation, normal strength, normal reflexes and intact cranial nerves. No cranial nerve deficit.  Skin: Skin is warm. No rash noted.  Psychiatric: Mood and affect normal.  Nursing note and vitals reviewed.     Assessment & Plan  Problem List Items Addressed This Visit    None    Visit Diagnoses    Gastroenteritis    -  Primary   cbc/met panel   Relevant Medications   ondansetron (ZOFRAN) 4 MG tablet   Hx of normocytic normochromic anemia       Dehydration       Weight loss          Meds ordered this encounter  Medications  .  ondansetron (ZOFRAN) 4 MG tablet    Sig: Take  1 tablet (4 mg total) by mouth every 8 (eight) hours as needed for nausea or vomiting.    Dispense:  20 tablet    Refill:  0      Dr. Otilio Miu [facility]  04/27/17

## 2017-04-27 NOTE — Patient Instructions (Signed)
Dehydration, Adult Dehydration is when there is not enough fluid or water in your body. This happens when you lose more fluids than you take in. Dehydration can range from mild to very bad. It should be treated right away to keep it from getting very bad. Symptoms of mild dehydration may include:  Thirst.  Dry lips.  Slightly dry mouth.  Dry, warm skin.  Dizziness. Symptoms of moderate dehydration may include:  Very dry mouth.  Muscle cramps.  Dark pee (urine). Pee may be the color of tea.  Your body making less pee.  Your eyes making fewer tears.  Heartbeat that is uneven or faster than normal (palpitations).  Headache.  Light-headedness, especially when you stand up from sitting.  Fainting (syncope). Symptoms of very bad dehydration may include:  Changes in skin, such as: ? Cold and clammy skin. ? Blotchy (mottled) or pale skin. ? Skin that does not quickly return to normal after being lightly pinched and let go (poor skin turgor).  Changes in body fluids, such as: ? Feeling very thirsty. ? Your eyes making fewer tears. ? Not sweating when body temperature is high, such as in hot weather. ? Your body making very little pee.  Changes in vital signs, such as: ? Weak pulse. ? Pulse that is more than 100 beats a minute when you are sitting still. ? Fast breathing. ? Low blood pressure.  Other changes, such as: ? Sunken eyes. ? Cold hands and feet. ? Confusion. ? Lack of energy (lethargy). ? Trouble waking up from sleep. ? Short-term weight loss. ? Unconsciousness. Follow these instructions at home:  If told by your doctor, drink an ORS: ? Make an ORS by using instructions on the package. ? Start by drinking small amounts, about  cup (120 mL) every 5-10 minutes. ? Slowly drink more until you have had the amount that your doctor said to have.  Drink enough clear fluid to keep your pee clear or pale yellow. If you were told to drink an ORS, finish the ORS  first, then start slowly drinking clear fluids. Drink fluids such as: ? Water. Do not drink only water by itself. Doing that can make the salt (sodium) level in your body get too low (hyponatremia). ? Ice chips. ? Fruit juice that you have added water to (diluted). ? Low-calorie sports drinks.  Avoid: ? Alcohol. ? Drinks that have a lot of sugar. These include high-calorie sports drinks, fruit juice that does not have water added, and soda. ? Caffeine. ? Foods that are greasy or have a lot of fat or sugar.  Take over-the-counter and prescription medicines only as told by your doctor.  Do not take salt tablets. Doing that can make the salt level in your body get too high (hypernatremia).  Eat foods that have minerals (electrolytes). Examples include bananas, oranges, potatoes, tomatoes, and spinach.  Keep all follow-up visits as told by your doctor. This is important. Contact a doctor if:  You have belly (abdominal) pain that: ? Gets worse. ? Stays in one area (localizes).  You have a rash.  You have a stiff neck.  You get angry or annoyed more easily than normal (irritability).  You are more sleepy than normal.  You have a harder time waking up than normal.  You feel: ? Weak. ? Dizzy. ? Very thirsty.  You have peed (urinated) only a small amount of very dark pee during 6-8 hours. Get help right away if:  You have symptoms of   very bad dehydration.  You cannot drink fluids without throwing up (vomiting).  Your symptoms get worse with treatment.  You have a fever.  You have a very bad headache.  You are throwing up or having watery poop (diarrhea) and it: ? Gets worse. ? Does not go away.  You have blood or something green (bile) in your throw-up.  You have blood in your poop (stool). This may cause poop to look black and tarry.  You have not peed in 6-8 hours.  You pass out (faint).  Your heart rate when you are sitting still is more than 100 beats a  minute.  You have trouble breathing. This information is not intended to replace advice given to you by your health care provider. Make sure you discuss any questions you have with your health care provider. Document Released: 11/19/2008 Document Revised: 08/13/2015 Document Reviewed: 03/19/2015 Elsevier Interactive Patient Education  2018 [employer]  

## 2017-04-30 ENCOUNTER — Other Ambulatory Visit: Payer: Self-pay

## 2017-04-30 ENCOUNTER — Telehealth: Payer: Self-pay

## 2017-04-30 ENCOUNTER — Telehealth: Payer: Self-pay | Admitting: Family Medicine

## 2017-04-30 DIAGNOSIS — R109 Unspecified abdominal pain: Secondary | ICD-10-CM

## 2017-04-30 DIAGNOSIS — R634 Abnormal weight loss: Secondary | ICD-10-CM

## 2017-04-30 NOTE — Telephone Encounter (Signed)
Called pt to tell him that Dr Ronnald Ramp wanted to add phosphorus, protein, albumin level and magnesium to labs drawn due to weight loss, anemia and abdominal pain. Pt was notified and does not want to come in for additional test. He just "want to know why my stomach keeps hurting off and on." Pt stated that the med Dr Ronnald Ramp gave him doesn't really help. The medicine that was prescribed for GERD helps some. He was offered a further evaluation with Dr Allen Norris, in which he said had seen him last year. However, I see no documentation since 2016. I informed pt of this and also told him I saw where Dr Ronnald Ramp ordered imaging for abd. Pain in 2016, it doesn't appear that he went to that test. I put in the referral to Dr Allen Norris where pt can get further eval and told him if the abd pain gets much worse, he is to go to ER.

## 2017-05-01 ENCOUNTER — Ambulatory Visit: Payer: Medicare Other | Admitting: Family Medicine

## 2017-05-09 DIAGNOSIS — M1711 Unilateral primary osteoarthritis, right knee: Secondary | ICD-10-CM | POA: Diagnosis not present

## 2017-06-14 DIAGNOSIS — M84374A Stress fracture, right foot, initial encounter for fracture: Secondary | ICD-10-CM | POA: Diagnosis not present

## 2017-06-14 DIAGNOSIS — M79671 Pain in right foot: Secondary | ICD-10-CM | POA: Diagnosis not present

## 2017-06-20 ENCOUNTER — Ambulatory Visit: Payer: Non-veteran care | Admitting: Gastroenterology

## 2017-06-25 ENCOUNTER — Other Ambulatory Visit: Payer: Self-pay | Admitting: Family Medicine

## 2017-06-30 ENCOUNTER — Other Ambulatory Visit: Payer: Self-pay | Admitting: Family Medicine

## 2017-07-05 DIAGNOSIS — M84374D Stress fracture, right foot, subsequent encounter for fracture with routine healing: Secondary | ICD-10-CM | POA: Diagnosis not present

## 2017-08-15 ENCOUNTER — Other Ambulatory Visit: Payer: Self-pay

## 2017-08-15 ENCOUNTER — Ambulatory Visit: Payer: Non-veteran care | Admitting: Gastroenterology

## 2017-08-15 ENCOUNTER — Encounter: Payer: Self-pay | Admitting: *Deleted

## 2017-08-15 DIAGNOSIS — D509 Iron deficiency anemia, unspecified: Secondary | ICD-10-CM | POA: Insufficient documentation

## 2017-08-15 DIAGNOSIS — R6881 Early satiety: Secondary | ICD-10-CM | POA: Insufficient documentation

## 2017-08-15 DIAGNOSIS — R634 Abnormal weight loss: Secondary | ICD-10-CM | POA: Insufficient documentation

## 2017-08-15 NOTE — Progress Notes (Deleted)
Gastroenterology Consultation  Referring Provider:     Juline Patch, MD Primary Care Physician:  Juline Patch, MD Primary Gastroenterologist:  Dr. Allen Norris     Reason for Consultation:     Abdominal pain        HPI:   CAMERON SCHWINN is a 72 y.o. y/o male referred for consultation & management of Abdominal pain by Dr. Juline Patch, MD.  This patient comes in today after seeing me back in June 2016 for abdominal pain.  The patient at that time had an upper endoscopy and colonoscopy which showed him to have a ulcer in the antrum.  The patient now comes in with a report of alternating diarrhea and constipation and abdominal pain with nausea. The patient has seen his primary care physician, Dr. Ronnald Ramp and has questioned why he is having abdominal pain off and on.  The patient was recommended to come see me and reported that he had seen me last year although he had not.  It appears at that time last year he was seen by his primary care provider and investigational tests were ordered which the patient did not proceed with. The patient's most recent test in March showed him to have pancytopenia with a low white cell count low hemoglobin and low platelets. At that time the patient's sodium was also low. At the time of the upper endoscopy the patient also was found to have a small hiatal hernia. The biopsies of the ulcer area did not show any dysplasia or intestinal metaplasia or H. Pylori.  Past Medical History:  Diagnosis Date  . Anemia   . Arthritis    Osteoarthritis  . Coronary artery disease   . Full dentures    upper and lower  . GERD (gastroesophageal reflux disease)   . Hyperlipidemia   . Hypertension     Past Surgical History:  Procedure Laterality Date  . APPENDECTOMY  1979  . BACK SURGERY  [phone]  . CHOLECYSTECTOMY  2013  . COLONOSCOPY N/A 06/25/2014   Procedure: COLONOSCOPY;  Surgeon: Lucilla Lame, MD;  Location: [place];  Service: Gastroenterology;   Laterality: N/A;  cecum time- 0906  . CORONARY ANGIOPLASTY WITH STENT PLACEMENT  2006  . ESOPHAGOGASTRODUODENOSCOPY N/A 06/25/2014   Procedure: ESOPHAGOGASTRODUODENOSCOPY (EGD);  Surgeon: Lucilla Lame, MD;  Location: [place];  Service: Gastroenterology;  Laterality: N/A;  . FLEXIBLE SIGMOIDOSCOPY  12/13/2010  . FRACTURE SURGERY Left    femur  . HERNIA REPAIR    . ORIF FEMUR FRACTURE  2012  . ROTATOR CUFF REPAIR Right   . TONSILLECTOMY    . TOTAL KNEE ARTHROPLASTY Left 01/23/2017   Procedure: TOTAL KNEE ARTHROPLASTY;  Surgeon: Hessie Knows, MD;  Location: [facility];  Service: Orthopedics;  Laterality: Left;  . TOTAL KNEE ARTHROPLASTY Right 03/29/2017   Procedure: TOTAL KNEE ARTHROPLASTY;  Surgeon: Hessie Knows, MD;  Location: [facility];  Service: Orthopedics;  Laterality: Right;  . UMBILICAL HERNIA REPAIR      Prior to Admission medications   Medication Sig Start Date End Date Taking? Authorizing Provider  alfuzosin (UROXATRAL) 10 MG 24 hr tablet Take 10 mg by mouth at bedtime.    [provider]  amLODipine (NORVASC) 10 MG tablet Take 5 mg by mouth daily.     [provider]  aspirin EC 81 MG tablet Take 81 mg by mouth daily.    [provider]  atorvastatin (LIPITOR) 80 MG tablet Take 80 mg by mouth  every evening.     [provider]  carvedilol (COREG) 3.125 MG tablet Take 3.125 mg by mouth 2 (two) times daily with a meal. Morning & afternoon    [provider]  clonazePAM (KLONOPIN) 1 MG tablet Take 1 mg by mouth at bedtime.    [provider]  diclofenac (VOLTAREN) 75 MG EC tablet Take 75 mg by mouth 2 (two) times daily.     [provider]  docusate sodium (COLACE) 50 MG capsule Take 1 capsule (50 mg total) by mouth 2 (two) times daily. Patient taking differently: Take 50 mg by mouth 2 (two) times daily as needed for mild constipation.  06/21/16   Juline Patch, MD  FEROSUL 325 (65 Fe) MG tablet TAKE ONE TABLET  BY MOUTH EVERY MORNING WITH BREAKFAST 07/03/17   Juline Patch, MD  lisinopril (PRINIVIL,ZESTRIL) 20 MG tablet Take 10 mg by mouth daily.     [provider]  Omega-3 Fatty Acids (FISH OIL) 1200 MG CPDR Take 1,200 mg by mouth daily.     [provider]  ondansetron (ZOFRAN) 4 MG tablet Take 1 tablet (4 mg total) by mouth every 8 (eight) hours as needed for nausea or vomiting. 04/27/17   Juline Patch, MD  pantoprazole (PROTONIX) 40 MG tablet Take 40 mg by mouth 2 (two) times daily.    [provider]  vitamin C (ASCORBIC ACID) 500 MG tablet Take 500 mg by mouth daily.     [provider]    No family history on file.   Social History   Tobacco Use  . Smoking status: Former Smoker    Last attempt to quit: 02/06/1993    Years since quitting: 24.5  . Smokeless tobacco: Never Used  Substance Use Topics  . Alcohol use: No  . Drug use: No    Allergies as of 08/15/2017 - Review Complete 04/27/2017  Allergen Reaction Noted  . Ciprofloxacin Nausea Only 07/14/2014  . Flagyl [metronidazole] Nausea Only 06/21/2016    Review of Systems:    All systems reviewed and negative except where noted in HPI.   Physical Exam:  There were no vitals taken for this visit. No LMP for male patient. General:   Alert,  Well-developed, ***well-nourished, pleasant and cooperative in NAD Head:  Normocephalic and atraumatic. Eyes:  Sclera clear, no icterus.   Conjunctiva pink. Ears:  Normal auditory acuity. Nose:  No deformity, discharge, or lesions. Mouth:  No deformity or lesions,oropharynx pink & moist. Neck:  Supple; no masses or thyromegaly. Lungs:  Respirations even and unlabored.  Clear throughout to auscultation.   No wheezes, crackles, or rhonchi. No acute distress. Heart:  Regular rate and rhythm; no murmurs, clicks, rubs, or gallops. Abdomen:  Normal bowel sounds.  No bruits.  Soft, non-tender and non-distended without masses, hepatosplenomegaly or hernias  noted.  No guarding or rebound tenderness.  ***Negative Carnett sign.   Rectal:  Deferred.***  Msk:  Symmetrical without gross deformities.  ***Good, equal movement & strength bilaterally. Pulses:  Normal pulses noted. Extremities:  No clubbing or edema.  No cyanosis. Neurologic:  Alert and oriented x3;  grossly normal neurologically. Skin:  Intact without significant lesions or rashes.  ***No jaundice. Lymph Nodes:  No significant cervical adenopathy. Psych:  Alert and cooperative. Normal mood and affect.  Imaging Studies: No results found.  Assessment and Plan:   JAILEN COWARD is a 72 y.o. y/o male ***  Lucilla Lame, MD. Marval Regal  Note: This dictation was prepared with Dragon dictation along with smaller phrase technology. Any transcriptional errors that result from this process are unintentional.

## 2017-12-17 DIAGNOSIS — R8290 Unspecified abnormal findings in urine: Secondary | ICD-10-CM | POA: Diagnosis not present

## 2017-12-17 DIAGNOSIS — N390 Urinary tract infection, site not specified: Secondary | ICD-10-CM | POA: Diagnosis not present

## 2017-12-18 ENCOUNTER — Ambulatory Visit: Payer: Medicare Other | Admitting: Family Medicine

## 2018-01-14 ENCOUNTER — Other Ambulatory Visit: Payer: Self-pay | Admitting: Family Medicine

## 2018-01-14 DIAGNOSIS — N390 Urinary tract infection, site not specified: Secondary | ICD-10-CM | POA: Diagnosis not present

## 2018-03-15 ENCOUNTER — Ambulatory Visit (INDEPENDENT_AMBULATORY_CARE_PROVIDER_SITE_OTHER): Payer: Medicare Other | Admitting: Family Medicine

## 2018-03-15 ENCOUNTER — Encounter: Payer: Self-pay | Admitting: Family Medicine

## 2018-03-15 VITALS — BP 146/79 | HR 62 | Ht 68.0 in | Wt 138.0 lb

## 2018-03-15 DIAGNOSIS — D509 Iron deficiency anemia, unspecified: Secondary | ICD-10-CM | POA: Diagnosis not present

## 2018-03-15 DIAGNOSIS — Z23 Encounter for immunization: Secondary | ICD-10-CM

## 2018-03-15 MED ORDER — FERROUS SULFATE 325 (65 FE) MG PO TABS: ORAL_TABLET | ORAL | 1 refills | Status: DC

## 2018-03-15 NOTE — Progress Notes (Signed)
Date:  03/15/2018   Name:  Donald [name]   DOB:  Jun 18, 1945   MRN:  [id number]   Chief Complaint: prev 13 and Anemia (refill ferrous sulfate/ needs cbc)  Anemia  Presents for follow-up visit. Symptoms include bruises/bleeds easily. There has been no abdominal pain, anorexia, confusion, fever, leg swelling, light-headedness, malaise/fatigue, pallor, palpitations, paresthesias, pica or weight loss. Signs of blood loss that are not present include hematemesis, hematochezia and melena. There are no compliance problems.     Review of Systems  Constitutional: Negative for chills, fever, malaise/fatigue and weight loss.  HENT: Negative for drooling, ear discharge, ear pain and sore throat.   Respiratory: Negative for cough, shortness of breath and wheezing.   Cardiovascular: Negative for chest pain, palpitations and leg swelling.  Gastrointestinal: Negative for abdominal pain, anorexia, blood in stool, constipation, diarrhea, hematemesis, hematochezia, melena and nausea.  Endocrine: Negative for polydipsia.  Genitourinary: Negative for dysuria, frequency, hematuria and urgency.  Musculoskeletal: Negative for back pain, myalgias and neck pain.  Skin: Negative for pallor and rash.  Allergic/Immunologic: Negative for environmental allergies.  Neurological: Negative for dizziness, light-headedness, headaches and paresthesias.  Hematological: Bruises/bleeds easily.  Psychiatric/Behavioral: Negative for confusion and suicidal ideas. The patient is not nervous/anxious.     Patient Active Problem List   Diagnosis Date Noted  . Anemia, iron deficiency 08/15/2017  . Decreased body weight 08/15/2017  . Early satiety 08/15/2017  . Primary localized osteoarthritis of right knee 03/29/2017  . Protein-calorie malnutrition, severe 01/25/2017  . Primary osteoarthritis of left knee 01/23/2017  . Hypertension 07/14/2014  . GERD (gastroesophageal reflux disease) 07/14/2014  . Hyperlipidemia 07/14/2014    . Osteoarthritis 07/14/2014  . Gastric ulcer 07/14/2014  . Abdominal pain, epigastric 07/14/2014    Allergies  Allergen Reactions  . Ciprofloxacin Nausea Only  . Flagyl [Metronidazole] Nausea Only    Past Surgical History:  Procedure Laterality Date  . APPENDECTOMY  1979  . BACK SURGERY  [phone]  . CHOLECYSTECTOMY  2013  . COLONOSCOPY N/A 06/25/2014   Procedure: COLONOSCOPY;  Surgeon: Lucilla Lame, MD;  Location: [place];  Service: Gastroenterology;  Laterality: N/A;  cecum time- 0906  . CORONARY ANGIOPLASTY WITH STENT PLACEMENT  2006  . ESOPHAGOGASTRODUODENOSCOPY N/A 06/25/2014   Procedure: ESOPHAGOGASTRODUODENOSCOPY (EGD);  Surgeon: Lucilla Lame, MD;  Location: [place];  Service: Gastroenterology;  Laterality: N/A;  . FLEXIBLE SIGMOIDOSCOPY  12/13/2010  . FRACTURE SURGERY Left    femur  . HERNIA REPAIR    . ORIF FEMUR FRACTURE  2012  . ROTATOR CUFF REPAIR Right   . TONSILLECTOMY    . TOTAL KNEE ARTHROPLASTY Left 01/23/2017   Procedure: TOTAL KNEE ARTHROPLASTY;  Surgeon: Hessie Knows, MD;  Location: [facility];  Service: Orthopedics;  Laterality: Left;  . TOTAL KNEE ARTHROPLASTY Right 03/29/2017   Procedure: TOTAL KNEE ARTHROPLASTY;  Surgeon: Hessie Knows, MD;  Location: [facility];  Service: Orthopedics;  Laterality: Right;  . UMBILICAL HERNIA REPAIR      Social History   Tobacco Use  . Smoking status: Former Smoker    Last attempt to quit: 02/06/1993    Years since quitting: 25.1  . Smokeless tobacco: Never Used  Substance Use Topics  . Alcohol use: No  . Drug use: No     Medication list has been reviewed and updated.  Current Meds  Medication Sig  . alfuzosin (UROXATRAL) 10 MG 24 hr tablet Take 10 mg by mouth at bedtime.  Marland Kitchen amLODipine (NORVASC) 10 MG  tablet Take 5 mg by mouth daily.   Marland Kitchen aspirin EC 81 MG tablet Take 81 mg by mouth daily.  Marland Kitchen atorvastatin (LIPITOR) 80 MG tablet Take 80 mg by mouth every evening.   . carvedilol  (COREG) 3.125 MG tablet Take 3.125 mg by mouth 2 (two) times daily with a meal. Morning & afternoon  . clonazePAM (KLONOPIN) 1 MG tablet Take 1 mg by mouth at bedtime.  . diclofenac (VOLTAREN) 75 MG EC tablet Take 75 mg by mouth 2 (two) times daily.   Marland Kitchen docusate sodium (COLACE) 50 MG capsule Take 1 capsule (50 mg total) by mouth 2 (two) times daily. (Patient taking differently: Take 50 mg by mouth 2 (two) times daily as needed for mild constipation. )  . FEROSUL 325 (65 Fe) MG tablet TAKE ONE TABLET BY MOUTH EVERY MORNING WITH BREAKFAST  . lisinopril (PRINIVIL,ZESTRIL) 20 MG tablet Take 10 mg by mouth daily.   . ondansetron (ZOFRAN) 4 MG tablet Take 1 tablet (4 mg total) by mouth every 8 (eight) hours as needed for nausea or vomiting.  . pantoprazole (PROTONIX) 40 MG tablet Take 40 mg by mouth 2 (two) times daily.  . vitamin C (ASCORBIC ACID) 500 MG tablet Take 500 mg by mouth daily.     PHQ 2/9 Scores 04/27/2017 10/19/2015  PHQ - 2 Score 4 0  PHQ- 9 Score 6 -    Physical Exam Vitals signs and nursing note reviewed.  HENT:     Head: Normocephalic.     Right Ear: Tympanic membrane, ear canal and external ear normal.     Left Ear: Tympanic membrane, ear canal and external ear normal.     Nose: Nose normal.  Eyes:     General: No scleral icterus.       Right eye: No discharge.        Left eye: No discharge.     Conjunctiva/sclera: Conjunctivae normal.     Pupils: Pupils are equal, round, and reactive to light.  Neck:     Musculoskeletal: Normal range of motion and neck supple.     Thyroid: No thyromegaly.     Vascular: No JVD.     Trachea: No tracheal deviation.  Cardiovascular:     Rate and Rhythm: Normal rate and regular rhythm.     Heart sounds: Normal heart sounds, S1 normal and S2 normal. Heart sounds not distant. No murmur. No systolic murmur. No diastolic murmur. No friction rub. No gallop. No S3 or S4 sounds.   Pulmonary:     Effort: No respiratory distress.     Breath  sounds: Normal breath sounds. No wheezing or rales.  Abdominal:     General: Bowel sounds are normal.     Palpations: Abdomen is soft. There is no mass.     Tenderness: There is no abdominal tenderness. There is no guarding or rebound.  Musculoskeletal: Normal range of motion.        General: No tenderness.  Lymphadenopathy:     Cervical: No cervical adenopathy.  Skin:    General: Skin is warm.     Findings: No rash.  Neurological:     Mental Status: He is alert and oriented to person, place, and time.     Cranial Nerves: No cranial nerve deficit.     Deep Tendon Reflexes: Reflexes are normal and symmetric.     BP (!) 146/79   Pulse 62   Ht 5\' 8"  (1.727 m)   Wt 138 lb (62.6 kg)  SpO2 98%   BMI 20.98 kg/m   Assessment and Plan: 1. Iron deficiency anemia, unspecified iron deficiency anemia type Chronic. Controlled on medication- refill ferrous sulfate and draw cbc to see if one a day is sufficient. - ferrous sulfate (FEROSUL) 325 (65 FE) MG tablet; TAKE ONE TABLET BY MOUTH EVERY MORNING WITH BREAKFAST  Dispense: 90 tablet; Refill: 1 - CBC with Differential/Platelet  2. Need for vaccination against Streptococcus pneumoniae using pneumococcal conjugate vaccine 13 Discussed and administered - Pneumococcal conjugate vaccine 13-valent

## 2018-03-16 LAB — CBC WITH DIFFERENTIAL/PLATELET
BASOS ABS: 0 10*3/uL (ref 0.0–0.2)
Basos: 1 %
EOS (ABSOLUTE): 0.2 10*3/uL (ref 0.0–0.4)
Eos: 3 %
Hematocrit: 34.2 % — ABNORMAL LOW (ref 37.5–51.0)
Hemoglobin: 11.6 g/dL — ABNORMAL LOW (ref 13.0–17.7)
Immature Grans (Abs): 0 10*3/uL (ref 0.0–0.1)
Immature Granulocytes: 0 %
LYMPHS ABS: 1.5 10*3/uL (ref 0.7–3.1)
Lymphs: 25 %
MCH: 30.7 pg (ref 26.6–33.0)
MCHC: 33.9 g/dL (ref 31.5–35.7)
MCV: 91 fL (ref 79–97)
MONOCYTES: 9 %
MONOS ABS: 0.5 10*3/uL (ref 0.1–0.9)
NEUTROS PCT: 62 %
Neutrophils Absolute: 3.7 10*3/uL (ref 1.4–7.0)
PLATELETS: 138 10*3/uL — AB (ref 150–450)
RBC: 3.78 x10E6/uL — AB (ref 4.14–5.80)
RDW: 14.5 % (ref 11.6–15.4)
WBC: 5.9 10*3/uL (ref 3.4–10.8)

## 2018-08-11 IMAGING — DX DG KNEE 1-2V*L*
2 series · 2 of 2 positions shown · non-contrast
Comparison: CT scan of the knee November 03, 2016

CLINICAL DATA: Status post left total knee joint prosthesis
placement.

EXAM:
LEFT KNEE - 1-2 VIEW

[knee ap]
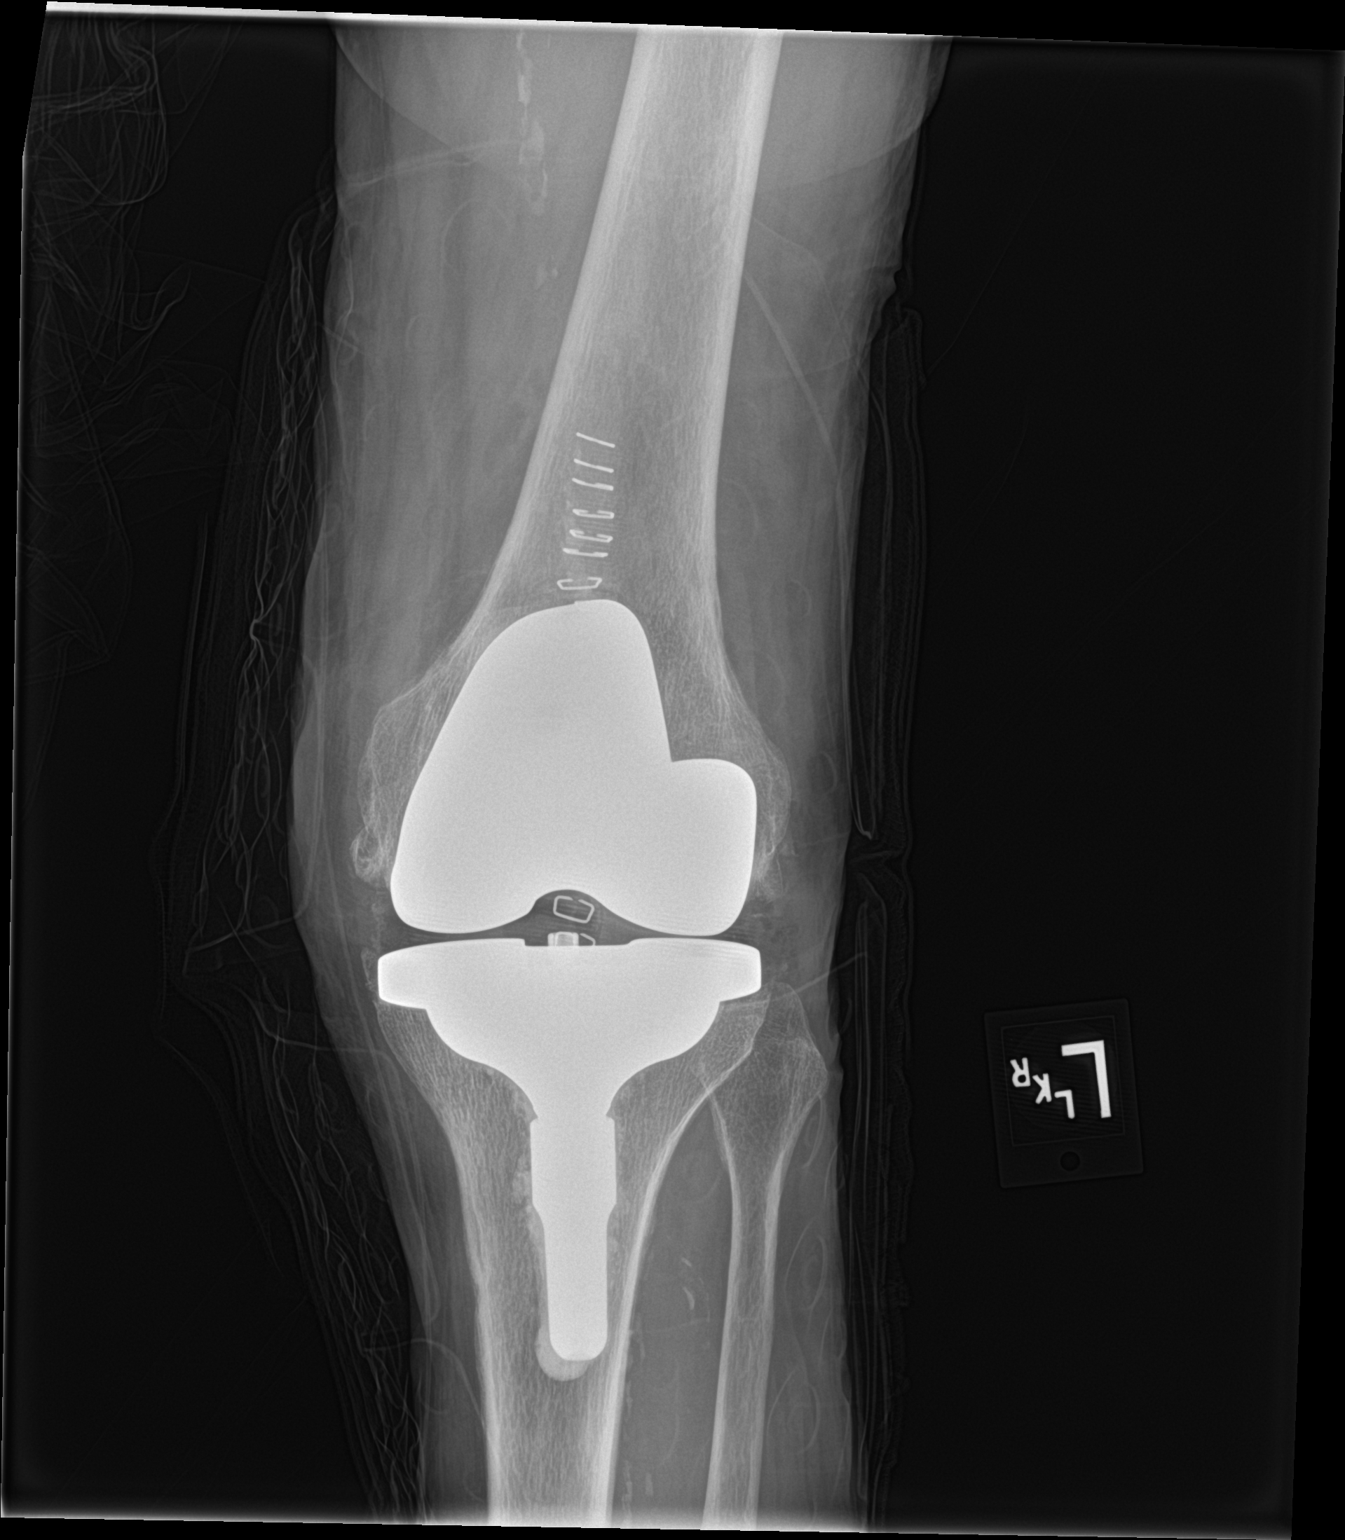

[knee lat]
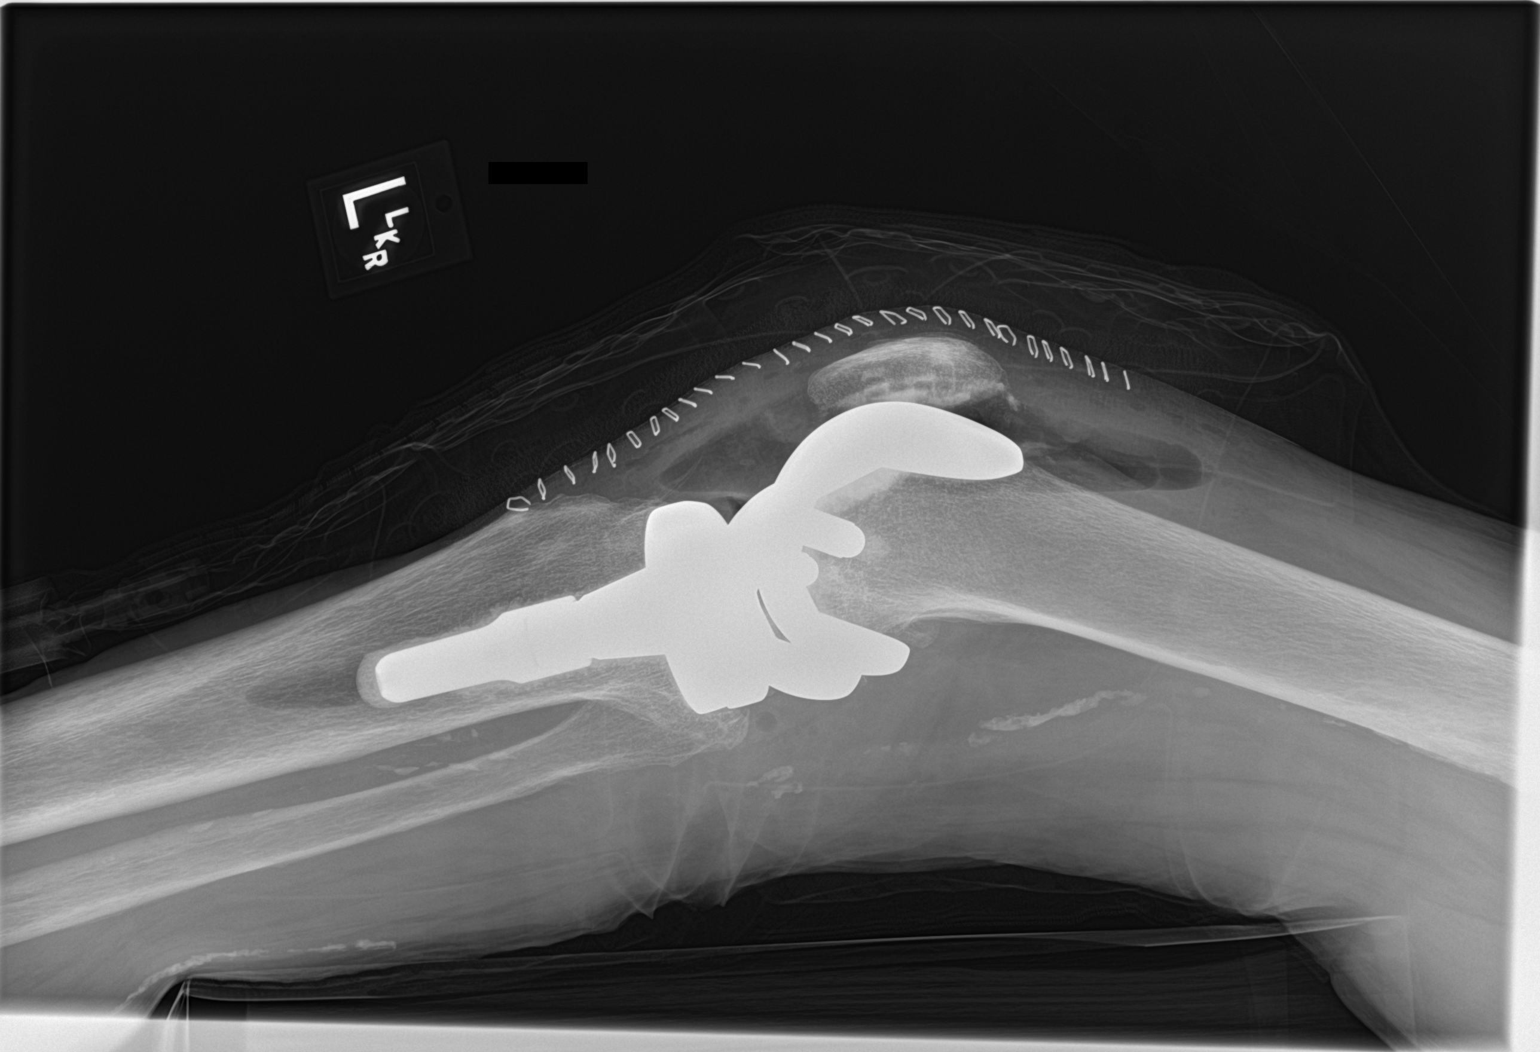

[2 of 2 positions shown; findings below may reference images not displayed]

FINDINGS: The patient has undergone total left knee joint prosthesis
placement. Radiographic positioning of the prosthetic components is
good. The interface of the prosthetic components with the native
bone appears normal. There is air in the anterior aspect of the
joint space.
IMPRESSION: No immediate postprocedure complication following left total knee
joint prosthesis placement.

## 2018-09-06 ENCOUNTER — Other Ambulatory Visit: Payer: Self-pay

## 2018-09-23 ENCOUNTER — Other Ambulatory Visit: Payer: Self-pay | Admitting: Family Medicine

## 2018-09-23 DIAGNOSIS — D509 Iron deficiency anemia, unspecified: Secondary | ICD-10-CM

## 2018-10-15 IMAGING — DX DG KNEE 1-2V*R*
2 series · 2 of 2 positions shown · non-contrast
Comparison: 11/03/2016

CLINICAL DATA: Followup knee arthroplasty.

EXAM:
RIGHT KNEE - 1-2 VIEW

[knee ap]
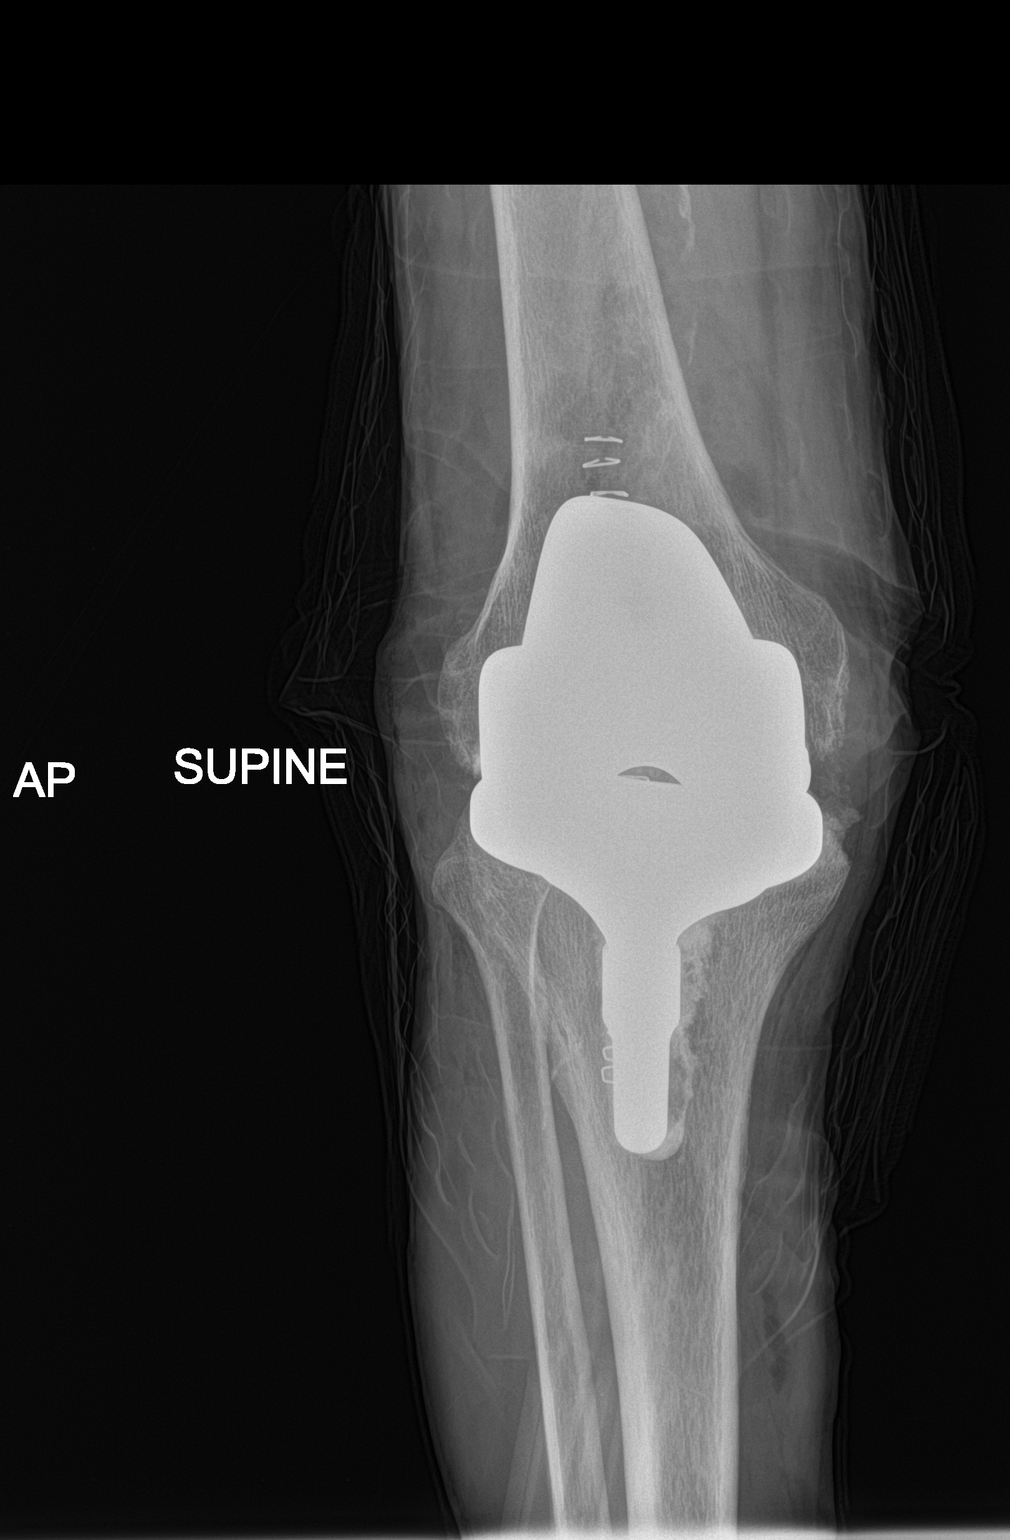

[knee lat]
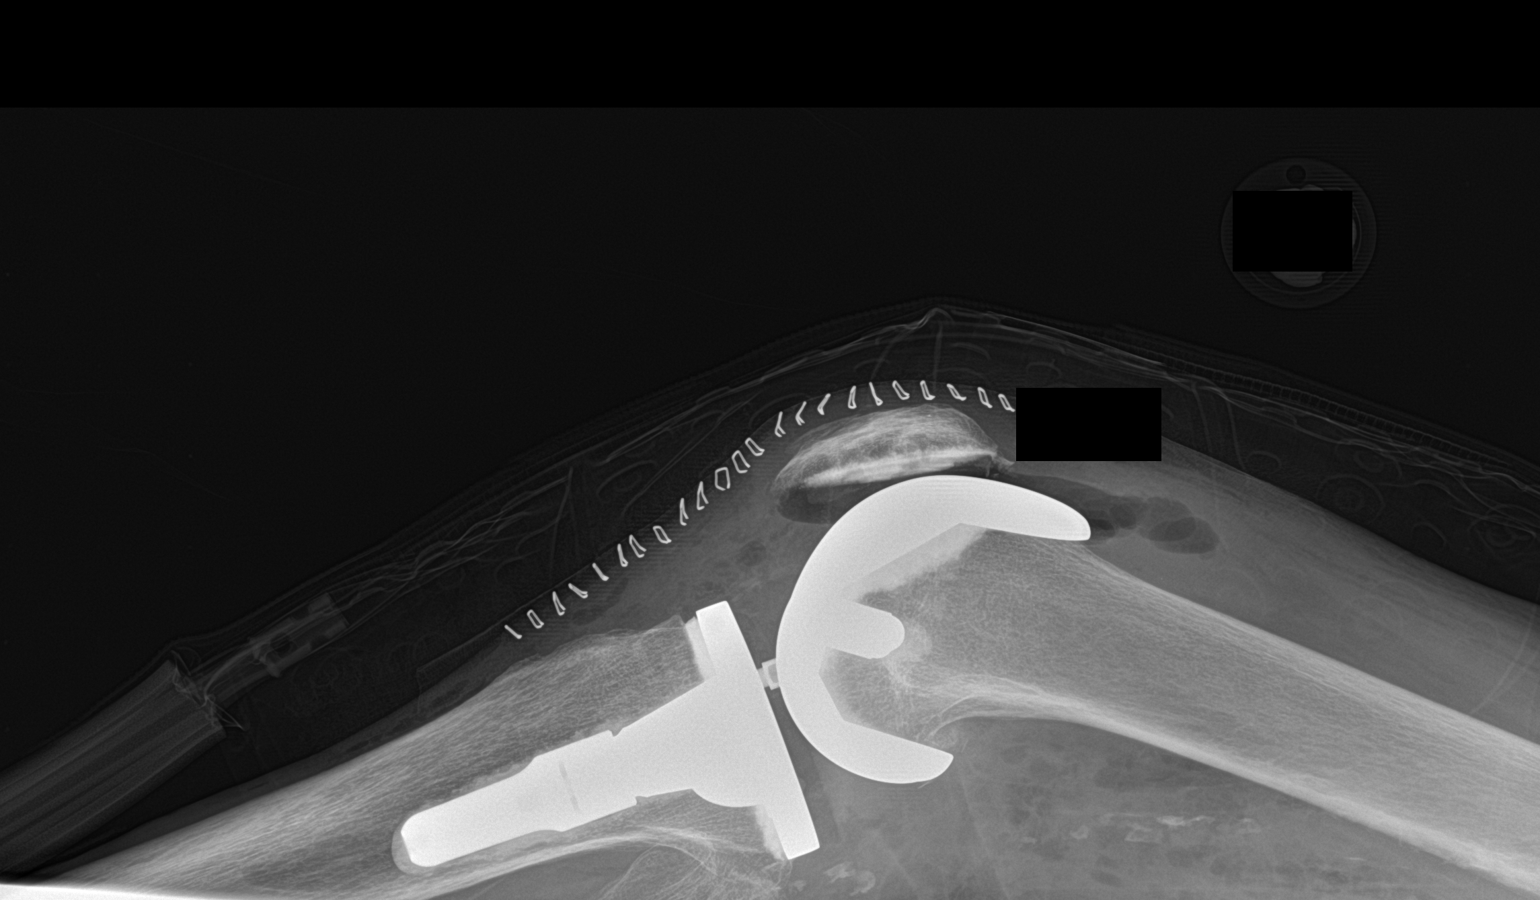

[2 of 2 positions shown; findings below may reference images not displayed]

FINDINGS: Total knee arthroplasty with components grossly well positioned. Air
in the joint as expected. No radiographically visible complication.
IMPRESSION: Good appearance following total knee replacement.

## 2018-10-18 DIAGNOSIS — Z23 Encounter for immunization: Secondary | ICD-10-CM | POA: Diagnosis not present

## 2018-10-31 ENCOUNTER — Other Ambulatory Visit: Payer: Self-pay | Admitting: Family Medicine

## 2018-10-31 DIAGNOSIS — D509 Iron deficiency anemia, unspecified: Secondary | ICD-10-CM

## 2018-12-18 ENCOUNTER — Other Ambulatory Visit: Payer: Self-pay | Admitting: Family Medicine

## 2018-12-18 DIAGNOSIS — D509 Iron deficiency anemia, unspecified: Secondary | ICD-10-CM

## 2019-01-24 ENCOUNTER — Other Ambulatory Visit: Payer: Self-pay | Admitting: Family Medicine

## 2019-01-24 DIAGNOSIS — D509 Iron deficiency anemia, unspecified: Secondary | ICD-10-CM

## 2019-02-11 DIAGNOSIS — S92332D Displaced fracture of third metatarsal bone, left foot, subsequent encounter for fracture with routine healing: Secondary | ICD-10-CM | POA: Diagnosis not present

## 2019-02-11 DIAGNOSIS — M79672 Pain in left foot: Secondary | ICD-10-CM | POA: Diagnosis not present

## 2019-02-11 DIAGNOSIS — S92322D Displaced fracture of second metatarsal bone, left foot, subsequent encounter for fracture with routine healing: Secondary | ICD-10-CM | POA: Diagnosis not present

## 2019-04-24 DIAGNOSIS — Z23 Encounter for immunization: Secondary | ICD-10-CM | POA: Diagnosis not present

## 2019-04-26 IMAGING — US US EXTREM LOW VENOUS*L*
1 series · 13 of 24 positions shown · non-contrast
Comparison: None.

CLINICAL DATA: Recent knee replacement.  Left leg swelling.



[Series 1: us extrem low venous*left* · 0.07mm/px · 13 of 34 slices shown]
[im 1/34]
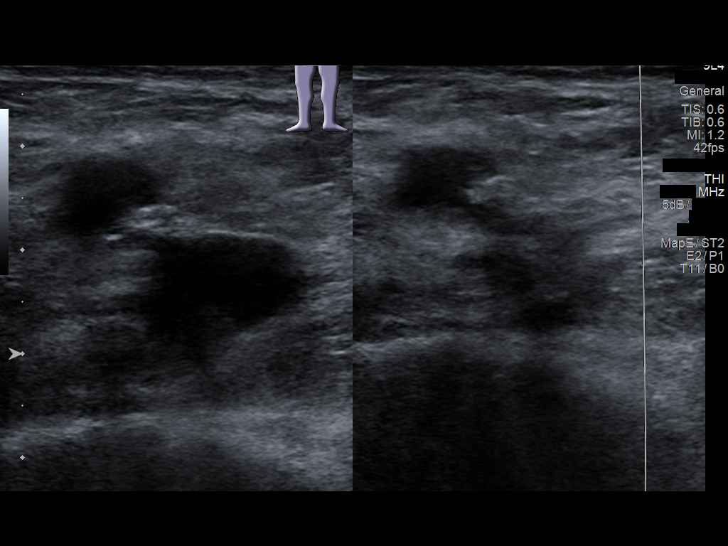
[im 3/34]
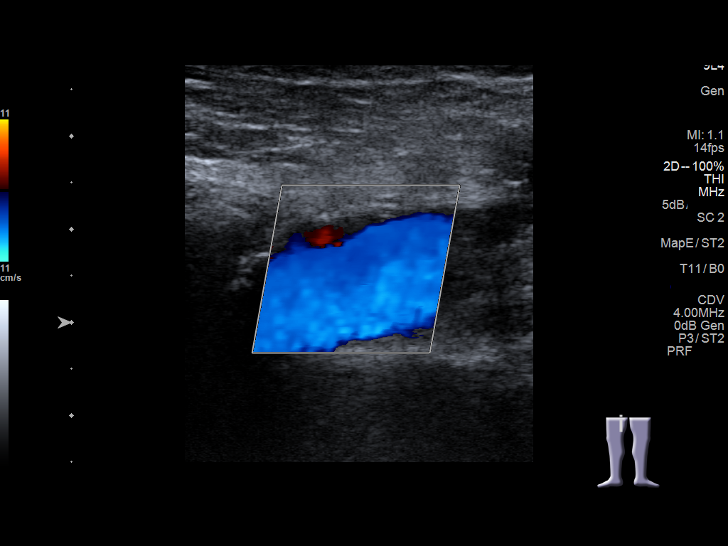
[im 6/34]
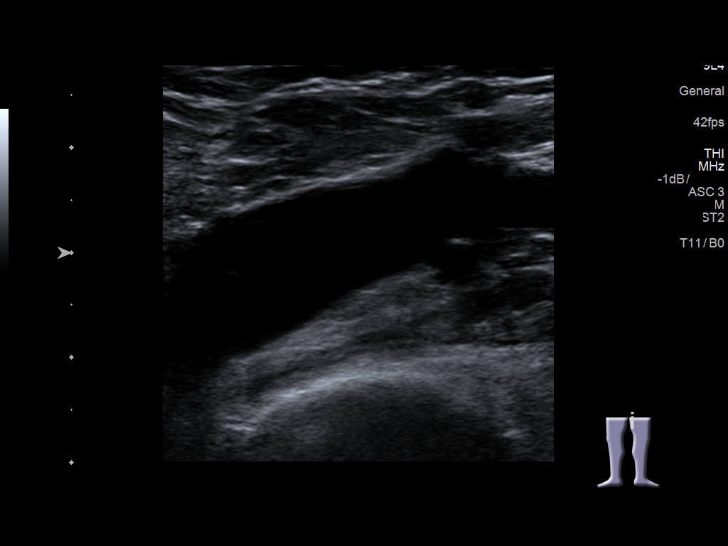
[im 9/34]
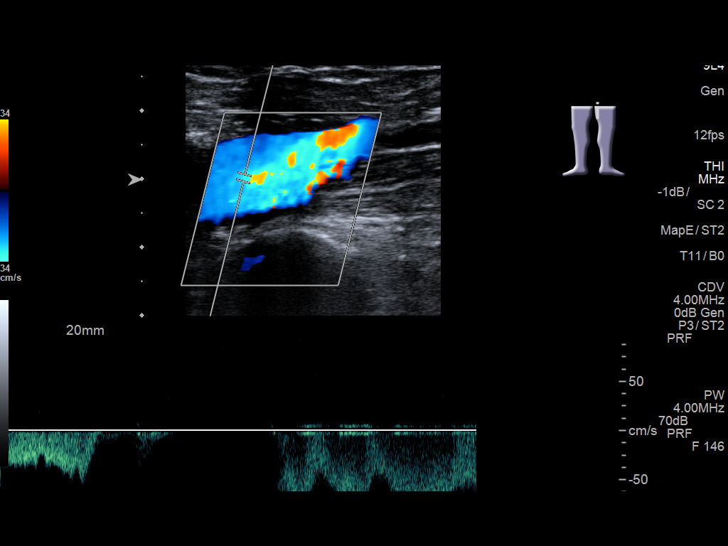
[im 12/34]
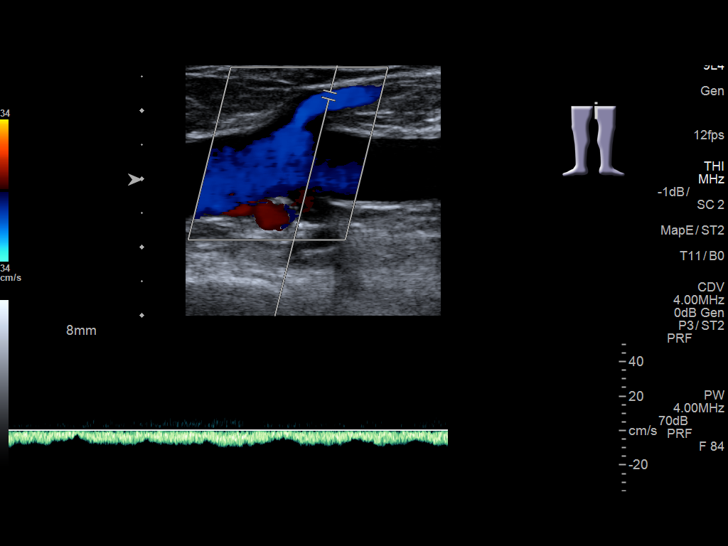
[im 15/34]
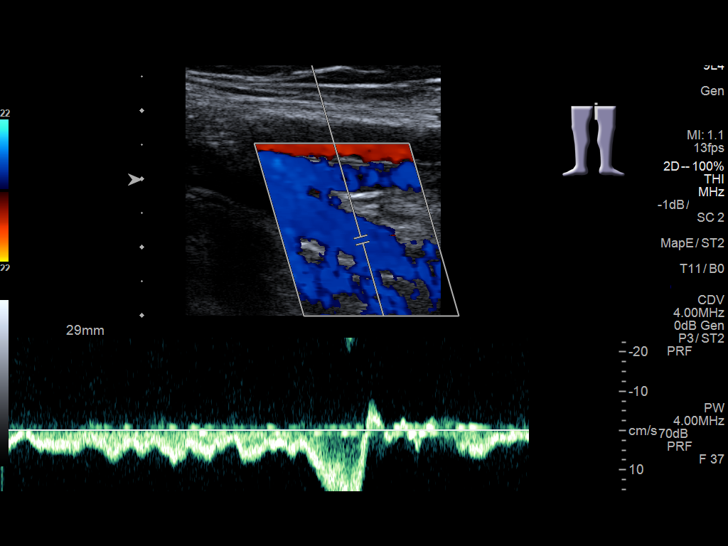
[im 18/34]
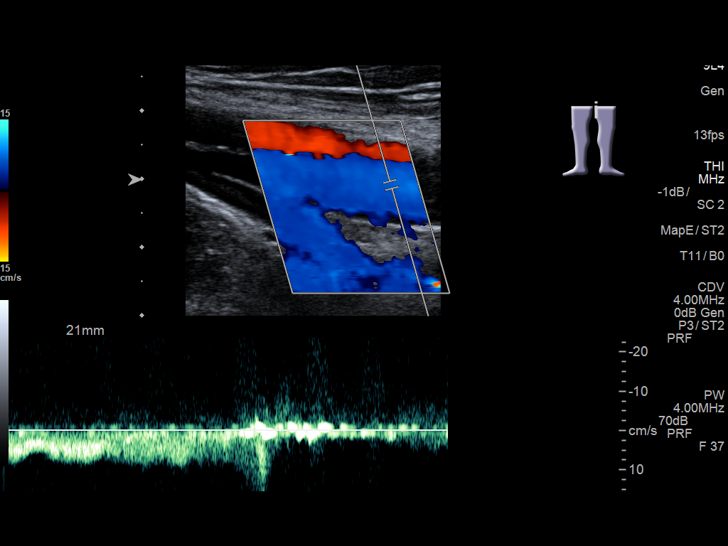
[im 19/34]
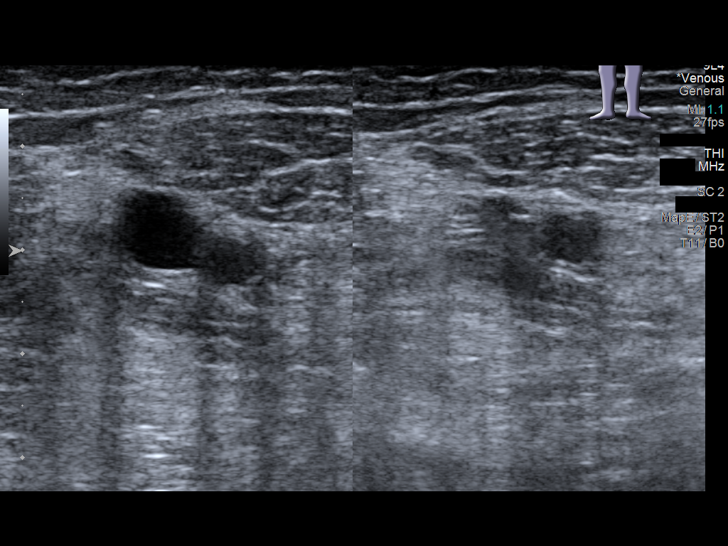
[im 22/34]
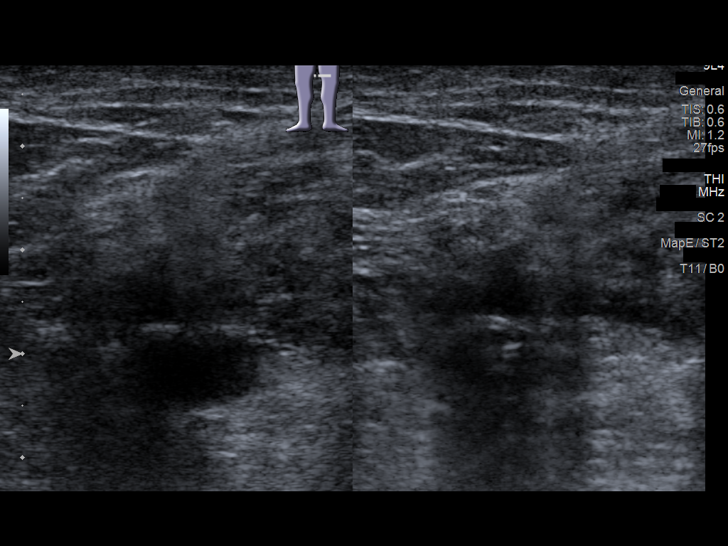
[im 25/34]
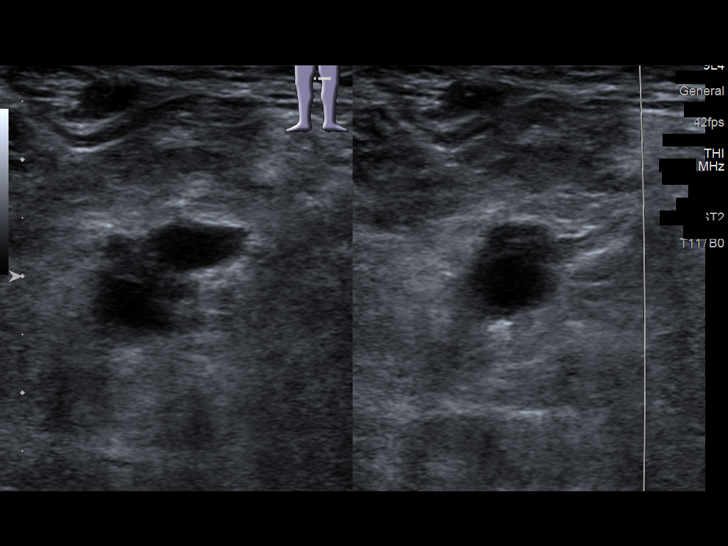
[im 28/34]
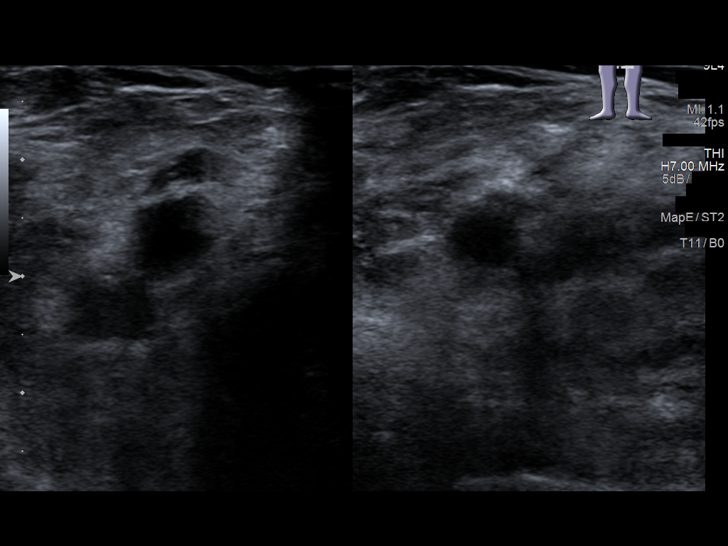
[im 31/34]
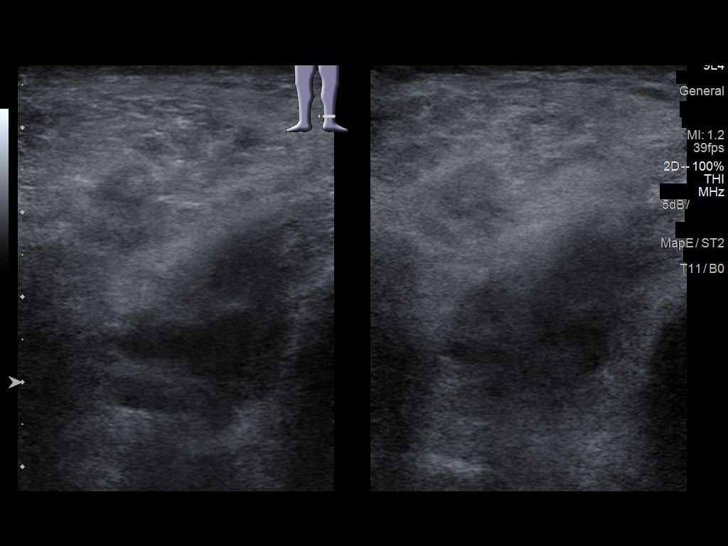
[im 34/34]
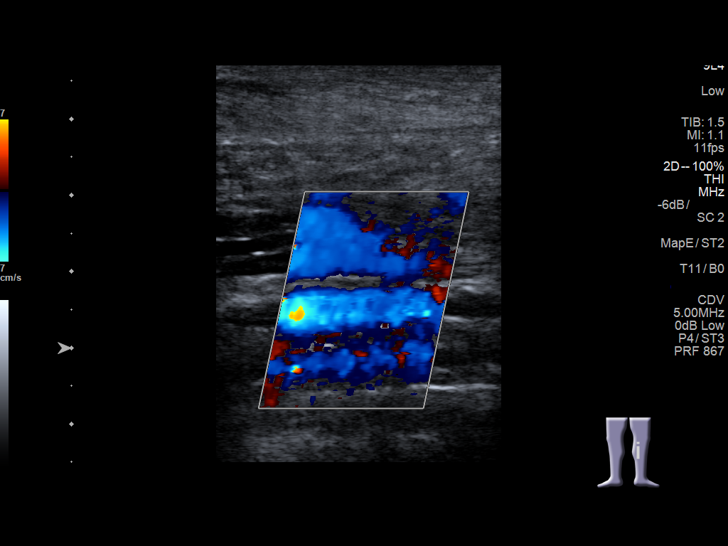

[13 of 24 positions shown; findings below may reference images not displayed]

FINDINGS: Contralateral Common Femoral Vein: Respiratory phasicity is normal
and symmetric with the symptomatic side. No evidence of thrombus.
Normal compressibility.

Common Femoral Vein: No evidence of thrombus. Normal
compressibility, respiratory phasicity and response to augmentation.

Saphenofemoral Junction: No evidence of thrombus. Normal
compressibility and flow on color Doppler imaging.

Profunda Femoral Vein: No evidence of thrombus. Normal
compressibility and flow on color Doppler imaging.

Femoral Vein: No evidence of thrombus. Normal compressibility,
respiratory phasicity and response to augmentation.

Popliteal Vein: No evidence of thrombus. Normal compressibility,
respiratory phasicity and response to augmentation.

Calf Veins: No evidence of thrombus. Normal compressibility and flow
on color Doppler imaging.
IMPRESSION: Negative for deep venous thrombosis in left lower extremity.

## 2019-05-16 DIAGNOSIS — Z23 Encounter for immunization: Secondary | ICD-10-CM | POA: Diagnosis not present

## 2019-06-27 DIAGNOSIS — S2241XA Multiple fractures of ribs, right side, initial encounter for closed fracture: Secondary | ICD-10-CM | POA: Diagnosis not present

## 2019-06-27 DIAGNOSIS — T1490XA Injury, unspecified, initial encounter: Secondary | ICD-10-CM | POA: Diagnosis not present

## 2019-06-27 DIAGNOSIS — R410 Disorientation, unspecified: Secondary | ICD-10-CM | POA: Diagnosis not present

## 2019-06-27 DIAGNOSIS — Z96652 Presence of left artificial knee joint: Secondary | ICD-10-CM | POA: Diagnosis not present

## 2019-06-27 DIAGNOSIS — S0502XA Injury of conjunctiva and corneal abrasion without foreign body, left eye, initial encounter: Secondary | ICD-10-CM | POA: Diagnosis not present

## 2019-06-27 DIAGNOSIS — R404 Transient alteration of awareness: Secondary | ICD-10-CM | POA: Diagnosis not present

## 2019-06-27 DIAGNOSIS — M19021 Primary osteoarthritis, right elbow: Secondary | ICD-10-CM | POA: Diagnosis not present

## 2019-06-27 DIAGNOSIS — M4312 Spondylolisthesis, cervical region: Secondary | ICD-10-CM | POA: Diagnosis not present

## 2019-06-27 DIAGNOSIS — S199XXA Unspecified injury of neck, initial encounter: Secondary | ICD-10-CM | POA: Diagnosis not present

## 2019-06-27 DIAGNOSIS — M25462 Effusion, left knee: Secondary | ICD-10-CM | POA: Diagnosis not present

## 2019-06-27 DIAGNOSIS — J9811 Atelectasis: Secondary | ICD-10-CM | POA: Diagnosis not present

## 2019-06-27 DIAGNOSIS — S3993XA Unspecified injury of pelvis, initial encounter: Secondary | ICD-10-CM | POA: Diagnosis not present

## 2019-06-27 DIAGNOSIS — S299XXA Unspecified injury of thorax, initial encounter: Secondary | ICD-10-CM | POA: Diagnosis not present

## 2019-06-27 DIAGNOSIS — M19022 Primary osteoarthritis, left elbow: Secondary | ICD-10-CM | POA: Diagnosis not present

## 2019-06-27 DIAGNOSIS — W19XXXA Unspecified fall, initial encounter: Secondary | ICD-10-CM | POA: Diagnosis not present

## 2019-06-27 DIAGNOSIS — M25562 Pain in left knee: Secondary | ICD-10-CM | POA: Diagnosis not present

## 2019-06-27 DIAGNOSIS — M8580 Other specified disorders of bone density and structure, unspecified site: Secondary | ICD-10-CM | POA: Diagnosis not present

## 2019-06-27 DIAGNOSIS — S0993XA Unspecified injury of face, initial encounter: Secondary | ICD-10-CM | POA: Diagnosis not present

## 2019-06-27 DIAGNOSIS — S0990XA Unspecified injury of head, initial encounter: Secondary | ICD-10-CM | POA: Diagnosis not present

## 2019-06-27 DIAGNOSIS — S4992XA Unspecified injury of left shoulder and upper arm, initial encounter: Secondary | ICD-10-CM | POA: Diagnosis not present

## 2019-06-27 DIAGNOSIS — I10 Essential (primary) hypertension: Secondary | ICD-10-CM | POA: Diagnosis not present

## 2019-06-27 DIAGNOSIS — M47816 Spondylosis without myelopathy or radiculopathy, lumbar region: Secondary | ICD-10-CM | POA: Diagnosis not present

## 2019-06-27 DIAGNOSIS — S8992XA Unspecified injury of left lower leg, initial encounter: Secondary | ICD-10-CM | POA: Diagnosis not present

## 2019-06-27 DIAGNOSIS — M25512 Pain in left shoulder: Secondary | ICD-10-CM | POA: Diagnosis not present

## 2019-06-27 DIAGNOSIS — S3991XA Unspecified injury of abdomen, initial encounter: Secondary | ICD-10-CM | POA: Diagnosis not present

## 2019-06-27 DIAGNOSIS — S069X9A Unspecified intracranial injury with loss of consciousness of unspecified duration, initial encounter: Secondary | ICD-10-CM | POA: Diagnosis not present

## 2019-06-27 DIAGNOSIS — M47812 Spondylosis without myelopathy or radiculopathy, cervical region: Secondary | ICD-10-CM | POA: Diagnosis not present

## 2019-07-15 DIAGNOSIS — M530 Cervicocranial syndrome: Secondary | ICD-10-CM | POA: Diagnosis not present

## 2019-07-15 DIAGNOSIS — M546 Pain in thoracic spine: Secondary | ICD-10-CM | POA: Diagnosis not present

## 2019-07-15 DIAGNOSIS — M9902 Segmental and somatic dysfunction of thoracic region: Secondary | ICD-10-CM | POA: Diagnosis not present

## 2019-07-15 DIAGNOSIS — M9901 Segmental and somatic dysfunction of cervical region: Secondary | ICD-10-CM | POA: Diagnosis not present

## 2019-07-16 DIAGNOSIS — M546 Pain in thoracic spine: Secondary | ICD-10-CM | POA: Diagnosis not present

## 2019-07-16 DIAGNOSIS — M530 Cervicocranial syndrome: Secondary | ICD-10-CM | POA: Diagnosis not present

## 2019-07-16 DIAGNOSIS — M9902 Segmental and somatic dysfunction of thoracic region: Secondary | ICD-10-CM | POA: Diagnosis not present

## 2019-07-16 DIAGNOSIS — M9901 Segmental and somatic dysfunction of cervical region: Secondary | ICD-10-CM | POA: Diagnosis not present

## 2019-07-18 DIAGNOSIS — M9902 Segmental and somatic dysfunction of thoracic region: Secondary | ICD-10-CM | POA: Diagnosis not present

## 2019-07-18 DIAGNOSIS — M530 Cervicocranial syndrome: Secondary | ICD-10-CM | POA: Diagnosis not present

## 2019-07-18 DIAGNOSIS — M9901 Segmental and somatic dysfunction of cervical region: Secondary | ICD-10-CM | POA: Diagnosis not present

## 2019-07-18 DIAGNOSIS — M546 Pain in thoracic spine: Secondary | ICD-10-CM | POA: Diagnosis not present

## 2019-07-21 DIAGNOSIS — M546 Pain in thoracic spine: Secondary | ICD-10-CM | POA: Diagnosis not present

## 2019-07-21 DIAGNOSIS — M530 Cervicocranial syndrome: Secondary | ICD-10-CM | POA: Diagnosis not present

## 2019-07-21 DIAGNOSIS — M9902 Segmental and somatic dysfunction of thoracic region: Secondary | ICD-10-CM | POA: Diagnosis not present

## 2019-07-21 DIAGNOSIS — M9901 Segmental and somatic dysfunction of cervical region: Secondary | ICD-10-CM | POA: Diagnosis not present

## 2019-08-04 DIAGNOSIS — K529 Noninfective gastroenteritis and colitis, unspecified: Secondary | ICD-10-CM | POA: Insufficient documentation

## 2019-08-04 DIAGNOSIS — I10 Essential (primary) hypertension: Secondary | ICD-10-CM | POA: Insufficient documentation

## 2019-08-04 DIAGNOSIS — H353190 Nonexudative age-related macular degeneration, unspecified eye, stage unspecified: Secondary | ICD-10-CM | POA: Insufficient documentation

## 2019-08-14 DIAGNOSIS — Z96659 Presence of unspecified artificial knee joint: Secondary | ICD-10-CM | POA: Diagnosis not present

## 2019-08-14 DIAGNOSIS — T8484XA Pain due to internal orthopedic prosthetic devices, implants and grafts, initial encounter: Secondary | ICD-10-CM | POA: Diagnosis not present

## 2019-08-18 ENCOUNTER — Other Ambulatory Visit: Payer: Self-pay | Admitting: Orthopedic Surgery

## 2019-08-18 DIAGNOSIS — M1711 Unilateral primary osteoarthritis, right knee: Secondary | ICD-10-CM

## 2019-08-18 DIAGNOSIS — M659 Synovitis and tenosynovitis, unspecified: Secondary | ICD-10-CM | POA: Diagnosis not present

## 2019-08-18 DIAGNOSIS — Z96651 Presence of right artificial knee joint: Secondary | ICD-10-CM

## 2019-08-18 DIAGNOSIS — T8484XD Pain due to internal orthopedic prosthetic devices, implants and grafts, subsequent encounter: Secondary | ICD-10-CM | POA: Diagnosis not present

## 2019-08-27 ENCOUNTER — Other Ambulatory Visit: Payer: Self-pay

## 2019-08-27 ENCOUNTER — Ambulatory Visit
Admission: RE | Admit: 2019-08-27 | Discharge: 2019-08-27 | Disposition: A | Discharge status: Home / Self Care | Payer: Medicare Other | Source: Ambulatory Visit | Attending: Orthopedic Surgery | Admitting: Orthopedic Surgery

## 2019-08-27 ENCOUNTER — Encounter
Admission: RE | Admit: 2019-08-27 | Discharge: 2019-08-27 | Disposition: A | Discharge status: Home / Self Care | Payer: Medicare Other | Source: Ambulatory Visit | Attending: Orthopedic Surgery | Admitting: Orthopedic Surgery

## 2019-08-27 DIAGNOSIS — M7989 Other specified soft tissue disorders: Secondary | ICD-10-CM | POA: Diagnosis not present

## 2019-08-27 DIAGNOSIS — Z96651 Presence of right artificial knee joint: Secondary | ICD-10-CM | POA: Insufficient documentation

## 2019-08-27 DIAGNOSIS — M1711 Unilateral primary osteoarthritis, right knee: Secondary | ICD-10-CM | POA: Insufficient documentation

## 2019-08-27 DIAGNOSIS — M25561 Pain in right knee: Secondary | ICD-10-CM | POA: Diagnosis not present

## 2019-08-27 DIAGNOSIS — T8484XD Pain due to internal orthopedic prosthetic devices, implants and grafts, subsequent encounter: Secondary | ICD-10-CM | POA: Diagnosis not present

## 2019-08-27 DIAGNOSIS — Z96653 Presence of artificial knee joint, bilateral: Secondary | ICD-10-CM | POA: Diagnosis not present

## 2019-08-27 MED ORDER — TECHNETIUM TC 99M MEDRONATE IV KIT
22.2980 | PACK | Freq: Once | INTRAVENOUS | Status: AC | PRN
  Administered 2019-08-27: 10:00:00 22.298 via INTRAVENOUS

## 2019-12-16 DIAGNOSIS — Z23 Encounter for immunization: Secondary | ICD-10-CM | POA: Diagnosis not present

## 2019-12-16 DIAGNOSIS — Z7189 Other specified counseling: Secondary | ICD-10-CM | POA: Diagnosis not present

## 2020-01-07 ENCOUNTER — Encounter: Payer: Self-pay | Admitting: Gastroenterology

## 2020-01-07 ENCOUNTER — Ambulatory Visit (INDEPENDENT_AMBULATORY_CARE_PROVIDER_SITE_OTHER): Payer: No Typology Code available for payment source | Admitting: Gastroenterology

## 2020-01-07 ENCOUNTER — Other Ambulatory Visit: Payer: Self-pay

## 2020-01-07 VITALS — BP 114/61 | HR 71 | Ht 68.0 in | Wt 126.2 lb

## 2020-01-07 DIAGNOSIS — K219 Gastro-esophageal reflux disease without esophagitis: Secondary | ICD-10-CM

## 2020-01-07 DIAGNOSIS — R1013 Epigastric pain: Secondary | ICD-10-CM

## 2020-01-07 DIAGNOSIS — S43429A Sprain of unspecified rotator cuff capsule, initial encounter: Secondary | ICD-10-CM | POA: Insufficient documentation

## 2020-01-07 DIAGNOSIS — G8929 Other chronic pain: Secondary | ICD-10-CM | POA: Diagnosis not present

## 2020-01-07 NOTE — Progress Notes (Signed)
Gastroenterology Consultation  Referring Provider:     [name], MD Primary Care Physician:  Juline Patch, MD Primary Gastroenterologist:  Dr. Allen Norris     Reason for Consultation:     Abdominal pain and weight loss        HPI:   Donald [name] is a 74 y.o. y/o male referred for consultation & management of abdominal pain and weight loss by Dr. Ronnald Ramp, Iven Finn, MD.  This patient comes in today with a history of a gastric ulcer back in 2016.  The patient was given Dexilant at that time but he states that he had to do stepwise [facility].  The patient was put on Protonix which worked up until recently.  The patient now reports that he is unable to eat due to his abdominal pain and he is taking his Protonix up to 3 times a day without any relief.  He did go to the pharmacy and was given some liquid that he states he takes before he goes to sleep because his pain and discomfort is so severe.  The patient has lost approximately 10 pounds in the last 4 weeks without trying.  There is no report of any black stools or bloody stools.  He also denies any dysphagia.  Past Medical History:  Diagnosis Date  . Anemia   . Arthritis    Osteoarthritis  . Coronary artery disease   . Full dentures    upper and lower  . GERD (gastroesophageal reflux disease)   . Hyperlipidemia   . Hypertension     Past Surgical History:  Procedure Laterality Date  . APPENDECTOMY  1979  . BACK SURGERY  [phone]  . CHOLECYSTECTOMY  2013  . COLONOSCOPY N/A 06/25/2014   Procedure: COLONOSCOPY;  Surgeon: Lucilla Lame, MD;  Location: [place];  Service: Gastroenterology;  Laterality: N/A;  cecum time- 0906  . CORONARY ANGIOPLASTY WITH STENT PLACEMENT  2006  . ESOPHAGOGASTRODUODENOSCOPY N/A 06/25/2014   Procedure: ESOPHAGOGASTRODUODENOSCOPY (EGD);  Surgeon: Lucilla Lame, MD;  Location: [place];  Service: Gastroenterology;  Laterality: N/A;  .  FLEXIBLE SIGMOIDOSCOPY  12/13/2010  . FRACTURE SURGERY Left    femur  . HERNIA REPAIR    . ORIF FEMUR FRACTURE  2012  . ROTATOR CUFF REPAIR Right   . TONSILLECTOMY    . TOTAL KNEE ARTHROPLASTY Left 01/23/2017   Procedure: TOTAL KNEE ARTHROPLASTY;  Surgeon: Hessie Knows, MD;  Location: [facility];  Service: Orthopedics;  Laterality: Left;  . TOTAL KNEE ARTHROPLASTY Right 03/29/2017   Procedure: TOTAL KNEE ARTHROPLASTY;  Surgeon: Hessie Knows, MD;  Location: [facility];  Service: Orthopedics;  Laterality: Right;  . UMBILICAL HERNIA REPAIR      Prior to Admission medications   Medication Sig Start Date End Date Taking? Authorizing Provider  alfuzosin (UROXATRAL) 10 MG 24 hr tablet Take 10 mg by mouth at bedtime.    [provider]  amLODipine (NORVASC) 10 MG tablet Take 5 mg by mouth daily.     [provider]  aspirin EC 81 MG tablet Take 81 mg by mouth daily.    [provider]  atorvastatin (LIPITOR) 80 MG tablet Take 80 mg by mouth every evening.     [provider]  carvedilol (COREG) 3.125 MG tablet Take 3.125 mg by mouth 2 (two) times daily with a meal. Morning & afternoon    [provider]  clonazePAM (KLONOPIN) 1 MG tablet Take  1 mg by mouth at bedtime.    [provider]  diclofenac (VOLTAREN) 75 MG EC tablet Take 75 mg by mouth 2 (two) times daily.     [provider]  docusate sodium (COLACE) 50 MG capsule Take 1 capsule (50 mg total) by mouth 2 (two) times daily. Patient taking differently: Take 50 mg by mouth 2 (two) times daily as needed for mild constipation.  06/21/16   Juline Patch, MD  FEROSUL 325 (65 Fe) MG tablet TAKE ONE TABLET BY MOUTH EVERY MORNING WITH BREAKFAST 01/24/19   Juline Patch, MD  lisinopril (PRINIVIL,ZESTRIL) 20 MG tablet Take 10 mg by mouth daily.     [provider]  ondansetron (ZOFRAN) 4 MG tablet Take 1 tablet (4 mg total) by mouth every 8 (eight) hours as needed for nausea or  vomiting. 04/27/17   Juline Patch, MD  pantoprazole (PROTONIX) 40 MG tablet Take 40 mg by mouth 2 (two) times daily.    [provider]  vitamin C (ASCORBIC ACID) 500 MG tablet Take 500 mg by mouth daily.     [provider]    No family history on file.   Social History   Tobacco Use  . Smoking status: Former Smoker    Quit date: 02/06/1993    Years since quitting: 26.9  . Smokeless tobacco: Never Used  Vaping Use  . Vaping Use: Never used  Substance Use Topics  . Alcohol use: No  . Drug use: No    Allergies as of 01/07/2020 - Review Complete 08/27/2019  Allergen Reaction Noted  . Ciprofloxacin Nausea Only 07/14/2014  . Flagyl [metronidazole] Nausea Only 06/21/2016    Review of Systems:    All systems reviewed and negative except where noted in HPI.   Physical Exam:  There were no vitals taken for this visit. No LMP for male patient. General:   Alert,  Well-developed, well-nourished, pleasant and cooperative in NAD Head:  Normocephalic and atraumatic. Eyes:  Sclera clear, no icterus.   Conjunctiva pink. Ears:  Normal auditory acuity. Neck:  Supple; no masses or thyromegaly. Lungs:  Respirations even and unlabored.  Clear throughout to auscultation.   No wheezes, crackles, or rhonchi. No acute distress. Heart:  Regular rate and rhythm; no murmurs, clicks, rubs, or gallops. Abdomen:  Normal bowel sounds.  No bruits.  Soft, non-tender and non-distended without masses, hepatosplenomegaly or hernias noted.  No guarding or rebound tenderness.  Negative Carnett sign.   Rectal:  Deferred.  Pulses:  Normal pulses noted. Extremities:  No clubbing or edema.  No cyanosis. Neurologic:  Alert and oriented x3;  grossly normal neurologically. Skin:  Intact without significant lesions or rashes.  No jaundice. Lymph Nodes:  No significant cervical adenopathy. Psych:  Alert and cooperative. Normal mood and affect.  Imaging Studies: No results found.  Assessment and  Plan:   Donald [name] is a 74 y.o. y/o male who comes in today with a history of gastric ulcers and now reports reflux symptoms with Protonix not working anymore.  The patient also has had a 10 pound weight loss in the last 4 weeks and abdominal discomfort.  The patient will be set up for an EGD and will be switched to a different PPI.  The patient will also be set up for an upper endoscopy to rule out any recurrence of his peptic ulcer disease and to rule out any malignancy.  The patient has been explained the plan and agrees with  it.    Lucilla Lame, MD. Marval Regal    Note: This dictation was prepared with Dragon dictation along with smaller phrase technology. Any transcriptional errors that result from this process are unintentional.

## 2020-01-07 NOTE — Addendum Note (Signed)
Addended byGlennie Isle E on: 01/07/2020 11:22 AM   Modules accepted: Orders, SmartSet

## 2020-01-08 ENCOUNTER — Encounter: Payer: Self-pay | Admitting: Gastroenterology

## 2020-01-13 ENCOUNTER — Telehealth: Payer: Self-pay | Admitting: Gastroenterology

## 2020-01-13 ENCOUNTER — Other Ambulatory Visit
Admission: RE | Admit: 2020-01-13 | Discharge: 2020-01-13 | Disposition: A | Discharge status: Home / Self Care | Payer: Medicare Other | Source: Ambulatory Visit | Attending: Gastroenterology | Admitting: Gastroenterology

## 2020-01-13 ENCOUNTER — Other Ambulatory Visit: Payer: Self-pay

## 2020-01-13 DIAGNOSIS — Z01812 Encounter for preprocedural laboratory examination: Secondary | ICD-10-CM | POA: Diagnosis not present

## 2020-01-13 DIAGNOSIS — Z20822 Contact with and (suspected) exposure to covid-19: Secondary | ICD-10-CM | POA: Diagnosis not present

## 2020-01-13 LAB — SARS CORONAVIRUS 2 (TAT 6-24 HRS): SARS Coronavirus 2: NEGATIVE

## 2020-01-13 NOTE — Telephone Encounter (Signed)
Pt needs instions on how to take medication Dexilant 60 mg

## 2020-01-13 NOTE — Discharge Instructions (Signed)
General Anesthesia, Adult, Care After This sheet gives you information about how to care for yourself after your procedure. Your health care provider may also give you more specific instructions. If you have problems or questions, contact your health care provider. What can I expect after the procedure? After the procedure, the following side effects are common:  Pain or discomfort at the IV site.  Nausea.  Vomiting.  Sore throat.  Trouble concentrating.  Feeling cold or chills.  Weak or tired.  Sleepiness and fatigue.  Soreness and body aches. These side effects can affect parts of the body that were not involved in surgery. Follow these instructions at home:  For at least 24 hours after the procedure:  Have a responsible adult stay with you. It is important to have someone help care for you until you are awake and alert.  Rest as needed.  Do not: ? Participate in activities in which you could fall or become injured. ? Drive. ? Use heavy machinery. ? Drink alcohol. ? Take sleeping pills or medicines that cause drowsiness. ? Make important decisions or sign legal documents. ? Take care of children on your own. Eating and drinking  Follow any instructions from your health care provider about eating or drinking restrictions.  When you feel hungry, start by eating small amounts of foods that are soft and easy to digest (bland), such as toast. Gradually return to your regular diet.  Drink enough fluid to keep your urine pale yellow.  If you vomit, rehydrate by drinking water, juice, or clear broth. General instructions  If you have sleep apnea, surgery and certain medicines can increase your risk for breathing problems. Follow instructions from your health care provider about wearing your sleep device: ? Anytime you are sleeping, including during daytime naps. ? While taking prescription pain medicines, sleeping medicines, or medicines that make you drowsy.  Return to  your normal activities as told by your health care provider. Ask your health care provider what activities are safe for you.  Take over-the-counter and prescription medicines only as told by your health care provider.  If you smoke, do not smoke without supervision.  Keep all follow-up visits as told by your health care provider. This is important. Contact a health care provider if:  You have nausea or vomiting that does not get better with medicine.  You cannot eat or drink without vomiting.  You have pain that does not get better with medicine.  You are unable to pass urine.  You develop a skin rash.  You have a fever.  You have redness around your IV site that gets worse. Get help right away if:  You have difficulty breathing.  You have chest pain.  You have blood in your urine or stool, or you vomit blood. Summary  After the procedure, it is common to have a sore throat or nausea. It is also common to feel tired.  Have a responsible adult stay with you for the first 24 hours after general anesthesia. It is important to have someone help care for you until you are awake and alert.  When you feel hungry, start by eating small amounts of foods that are soft and easy to digest (bland), such as toast. Gradually return to your regular diet.  Drink enough fluid to keep your urine pale yellow.  Return to your normal activities as told by your health care provider. Ask your health care provider what activities are safe for you. This information is not   intended to replace advice given to you by your health care provider. Make sure you discuss any questions you have with your health care provider. Document Revised: 01/26/2017 Document Reviewed: 09/08/2016 Elsevier Patient Education  2020 [employer]  

## 2020-01-14 NOTE — Telephone Encounter (Signed)
Pt notified to take the Dexilant 60mg  once daily.

## 2020-01-15 ENCOUNTER — Encounter
Admission: RE | Disposition: A | Discharge status: Home / Self Care | Payer: Self-pay | Source: Home / Self Care | Attending: Gastroenterology

## 2020-01-15 ENCOUNTER — Encounter: Payer: Self-pay | Admitting: Gastroenterology

## 2020-01-15 ENCOUNTER — Ambulatory Visit
Admission: RE | Admit: 2020-01-15 | Discharge: 2020-01-15 | Disposition: A | Discharge status: Home / Self Care | Payer: No Typology Code available for payment source | Attending: Gastroenterology | Admitting: Gastroenterology

## 2020-01-15 ENCOUNTER — Other Ambulatory Visit: Payer: Self-pay

## 2020-01-15 ENCOUNTER — Ambulatory Visit: Payer: No Typology Code available for payment source | Admitting: Anesthesiology

## 2020-01-15 DIAGNOSIS — Z96653 Presence of artificial knee joint, bilateral: Secondary | ICD-10-CM | POA: Diagnosis not present

## 2020-01-15 DIAGNOSIS — K253 Acute gastric ulcer without hemorrhage or perforation: Secondary | ICD-10-CM

## 2020-01-15 DIAGNOSIS — G8929 Other chronic pain: Secondary | ICD-10-CM | POA: Diagnosis not present

## 2020-01-15 DIAGNOSIS — R1013 Epigastric pain: Secondary | ICD-10-CM

## 2020-01-15 DIAGNOSIS — R634 Abnormal weight loss: Secondary | ICD-10-CM | POA: Insufficient documentation

## 2020-01-15 DIAGNOSIS — Z7982 Long term (current) use of aspirin: Secondary | ICD-10-CM | POA: Insufficient documentation

## 2020-01-15 DIAGNOSIS — K259 Gastric ulcer, unspecified as acute or chronic, without hemorrhage or perforation: Secondary | ICD-10-CM | POA: Diagnosis not present

## 2020-01-15 DIAGNOSIS — Z881 Allergy status to other antibiotic agents status: Secondary | ICD-10-CM | POA: Insufficient documentation

## 2020-01-15 DIAGNOSIS — Z791 Long term (current) use of non-steroidal anti-inflammatories (NSAID): Secondary | ICD-10-CM | POA: Insufficient documentation

## 2020-01-15 DIAGNOSIS — K317 Polyp of stomach and duodenum: Secondary | ICD-10-CM | POA: Diagnosis not present

## 2020-01-15 DIAGNOSIS — Z955 Presence of coronary angioplasty implant and graft: Secondary | ICD-10-CM | POA: Diagnosis not present

## 2020-01-15 DIAGNOSIS — K219 Gastro-esophageal reflux disease without esophagitis: Secondary | ICD-10-CM

## 2020-01-15 DIAGNOSIS — Z87891 Personal history of nicotine dependence: Secondary | ICD-10-CM | POA: Insufficient documentation

## 2020-01-15 HISTORY — PX: POLYPECTOMY: SHX5525

## 2020-01-15 HISTORY — PX: ESOPHAGOGASTRODUODENOSCOPY (EGD) WITH PROPOFOL: SHX5813

## 2020-01-15 SURGERY — ESOPHAGOGASTRODUODENOSCOPY (EGD) WITH PROPOFOL
Anesthesia: General | Site: Throat

## 2020-01-15 MED ORDER — ACETAMINOPHEN 160 MG/5ML PO SOLN: 325.0000 mg | Freq: Once | ORAL | Status: DC

## 2020-01-15 MED ORDER — LIDOCAINE HCL (CARDIAC) PF 100 MG/5ML IV SOSY
PREFILLED_SYRINGE | INTRAVENOUS | Status: DC | PRN
  Administered 2020-01-15: 50 mg via INTRAVENOUS

## 2020-01-15 MED ORDER — ACETAMINOPHEN 325 MG PO TABS: 325.0000 mg | ORAL_TABLET | Freq: Once | ORAL | Status: DC

## 2020-01-15 MED ORDER — PROPOFOL 10 MG/ML IV BOLUS
INTRAVENOUS | Status: DC | PRN
  Administered 2020-01-15: 50 mg via INTRAVENOUS
  Administered 2020-01-15: 20 mg via INTRAVENOUS

## 2020-01-15 MED ORDER — LACTATED RINGERS IV SOLN: INTRAVENOUS | Status: DC

## 2020-01-15 MED ORDER — SODIUM CHLORIDE 0.9 % IV SOLN: INTRAVENOUS | Status: DC

## 2020-01-15 MED ORDER — GLYCOPYRROLATE 0.2 MG/ML IJ SOLN
INTRAMUSCULAR | Status: DC | PRN
  Administered 2020-01-15: .2 mg via INTRAVENOUS

## 2020-01-15 SURGICAL SUPPLY — 8 items
BLOCK BITE 60FR ADLT L/F GRN (MISCELLANEOUS) ×3 IMPLANT
FORCEPS BIOP RAD 4 LRG CAP 4 (CUTTING FORCEPS) ×3 IMPLANT
GOWN CVR UNV OPN BCK APRN NK (MISCELLANEOUS) ×4 IMPLANT
GOWN ISOL THUMB LOOP REG UNIV (MISCELLANEOUS) ×6
KIT PRC NS LF DISP ENDO (KITS) ×2 IMPLANT
KIT PROCEDURE OLYMPUS (KITS) ×3
MANIFOLD NEPTUNE II (INSTRUMENTS) ×3 IMPLANT
WATER STERILE IRR 250ML POUR (IV SOLUTION) ×3 IMPLANT

## 2020-01-15 NOTE — Anesthesia Procedure Notes (Signed)
Procedure Name: MAC Date/Time: 01/15/2020 8:55 AM Performed by: Jeannene Patella, CRNA Pre-anesthesia Checklist: Patient identified, Emergency Drugs available, Suction available, Timeout performed and Patient being monitored Patient Re-evaluated:Patient Re-evaluated prior to induction Oxygen Delivery Method: Nasal cannula Placement Confirmation: positive ETCO2

## 2020-01-15 NOTE — Anesthesia Postprocedure Evaluation (Signed)
Anesthesia Post Note  Patient: Donald [name]  Procedure(s) Performed: ESOPHAGOGASTRODUODENOSCOPY (EGD) WITH PROPOFOL (N/A Throat) POLYPECTOMY (Throat)     Patient location during evaluation: PACU Anesthesia Type: General Level of consciousness: awake and alert and oriented Pain management: satisfactory to patient Vital Signs Assessment: post-procedure vital signs reviewed and stable Respiratory status: spontaneous breathing, nonlabored ventilation and respiratory function stable Cardiovascular status: blood pressure returned to baseline and stable Postop Assessment: Adequate PO intake and No signs of nausea or vomiting Anesthetic complications: no   No complications documented.  Raliegh Ip

## 2020-01-15 NOTE — Op Note (Signed)
[facility] Gastroenterology Patient Name: Donald [name] Procedure Date: 01/15/2020 8:43 AM MRN: [id number] Account #: [id number] Date of Birth: [birth year] Admit Type: Ambulatory Age: 74 Room: [facility] OR ROOM 01 Gender: Male Note Status: Finalized Procedure:             Upper GI endoscopy Indications:           Epigastric abdominal pain, Weight loss Providers:             Lucilla Lame MD, MD Referring MD:          Juline Patch, MD (Referring MD) Medicines:             Propofol per Anesthesia Complications:         No immediate complications. Procedure:             Pre-Anesthesia Assessment:                        - Prior to the procedure, a History and Physical was                         performed, and patient medications and allergies were                         reviewed. The patient's tolerance of previous                         anesthesia was also reviewed. The risks and benefits                         of the procedure and the sedation options and risks                         were discussed with the patient. All questions were                         answered, and informed consent was obtained. Prior                         Anticoagulants: The patient has taken no previous                         anticoagulant or antiplatelet agents. ASA Grade                         Assessment: II - A patient with mild systemic disease.                         After reviewing the risks and benefits, the patient                         was deemed in satisfactory condition to undergo the                         procedure.                        After obtaining informed consent, the endoscope was  passed under direct vision. Throughout the procedure,                         the patient's blood pressure, pulse, and oxygen                         saturations were monitored continuously. The was                         introduced through the mouth, and  advanced to the                         second part of duodenum. The upper GI endoscopy was                         accomplished without difficulty. The patient tolerated                         the procedure well. Findings:      The examined esophagus was normal.      Two sessile polyps with no stigmata of recent bleeding were found in the       gastric antrum. Biopsies were taken with a cold forceps for histology.      One non-bleeding superficial gastric ulcer with no stigmata of bleeding       was found at the pylorus.      The examined duodenum was normal. Impression:            - Normal esophagus.                        - Two gastric polyps. Biopsied.                        - Non-bleeding gastric ulcer with no stigmata of                         bleeding.                        - Normal examined duodenum. Recommendation:        - Discharge patient to home.                        - Resume previous diet.                        - Continue present medications. Procedure Code(s):     --- Professional ---                        [phone], Esophagogastroduodenoscopy, flexible,                         transoral; with biopsy, single or multiple Diagnosis Code(s):     --- Professional ---                        R63.4, Abnormal weight loss                        R10.13, Epigastric pain  K25.9, Gastric ulcer, unspecified as acute or chronic,                         without hemorrhage or perforation                        K31.7, Polyp of stomach and duodenum CPT copyright 2019 [employer]. All rights reserved. The codes documented in this report are preliminary and upon coder review may  be revised to meet current compliance requirements. Lucilla Lame MD, MD 01/15/2020 9:00:52 AM This report has been signed electronically. Number of Addenda: 0 Note Initiated On: 01/15/2020 8:43 AM Estimated Blood Loss:  Estimated blood loss: none.      [facility]

## 2020-01-15 NOTE — H&P (Signed)
Lucilla Lame, MD [facility] [address], [place], [place] [address] Phone:[phone] Fax : [phone]  Primary Care Physician:  Juline Patch, MD Primary Gastroenterologist:  Dr. Allen Norris  Pre-Procedure History & Physical: HPI:  Donald [name] is a 74 y.o. male is here for an endoscopy.   Past Medical History:  Diagnosis Date  . Anemia   . Arthritis    Osteoarthritis  . Coronary artery disease   . Full dentures    upper and lower  . GERD (gastroesophageal reflux disease)   . Hyperlipidemia   . Hypertension     Past Surgical History:  Procedure Laterality Date  . APPENDECTOMY  1979  . BACK SURGERY  [phone]  . CHOLECYSTECTOMY  2013  . COLONOSCOPY N/A 06/25/2014   Procedure: COLONOSCOPY;  Surgeon: Lucilla Lame, MD;  Location: [place];  Service: Gastroenterology;  Laterality: N/A;  cecum time- 0906  . CORONARY ANGIOPLASTY WITH STENT PLACEMENT  2006  . ESOPHAGOGASTRODUODENOSCOPY N/A 06/25/2014   Procedure: ESOPHAGOGASTRODUODENOSCOPY (EGD);  Surgeon: Lucilla Lame, MD;  Location: [place];  Service: Gastroenterology;  Laterality: N/A;  . FLEXIBLE SIGMOIDOSCOPY  12/13/2010  . FRACTURE SURGERY Left    femur  . HERNIA REPAIR    . ORIF FEMUR FRACTURE  2012  . ROTATOR CUFF REPAIR Right   . TONSILLECTOMY    . TOTAL KNEE ARTHROPLASTY Left 01/23/2017   Procedure: TOTAL KNEE ARTHROPLASTY;  Surgeon: Hessie Knows, MD;  Location: [facility];  Service: Orthopedics;  Laterality: Left;  . TOTAL KNEE ARTHROPLASTY Right 03/29/2017   Procedure: TOTAL KNEE ARTHROPLASTY;  Surgeon: Hessie Knows, MD;  Location: [facility];  Service: Orthopedics;  Laterality: Right;  . UMBILICAL HERNIA REPAIR      Prior to Admission medications   Medication Sig Start Date End Date Taking? Authorizing Provider  alfuzosin (UROXATRAL) 10 MG 24 hr tablet Take 10 mg by mouth at bedtime.   Yes [provider]  amLODipine (NORVASC) 10 MG tablet Take 5 mg by mouth daily.     Yes [provider]  aspirin EC 81 MG tablet Take 81 mg by mouth daily.   Yes [provider]  atorvastatin (LIPITOR) 80 MG tablet Take 80 mg by mouth every evening.    Yes [provider]  carvedilol (COREG) 3.125 MG tablet Take 3.125 mg by mouth 2 (two) times daily with a meal. Morning & afternoon   Yes [provider]  clonazePAM (KLONOPIN) 1 MG tablet Take 1 mg by mouth at bedtime.   Yes [provider]  diclofenac (VOLTAREN) 75 MG EC tablet Take 75 mg by mouth 2 (two) times daily.    Yes [provider]  docusate sodium (COLACE) 50 MG capsule Take 1 capsule (50 mg total) by mouth 2 (two) times daily. Patient taking differently: Take 50 mg by mouth 2 (two) times daily as needed for mild constipation. 06/21/16  Yes Juline Patch, MD  FEROSUL 325 (65 Fe) MG tablet TAKE ONE TABLET BY MOUTH EVERY MORNING WITH BREAKFAST 01/24/19  Yes Juline Patch, MD  lisinopril (PRINIVIL,ZESTRIL) 20 MG tablet Take 10 mg by mouth daily.    Yes [provider]  pantoprazole (PROTONIX) 40 MG tablet Take 40 mg by mouth 2 (two) times daily.   Yes [provider]  vitamin C (ASCORBIC ACID) 500 MG tablet Take 500 mg by mouth daily.    Yes [provider]  ondansetron (ZOFRAN) 4 MG tablet Take 1 tablet (4 mg total) by  mouth every 8 (eight) hours as needed for nausea or vomiting. 04/27/17   Juline Patch, MD    Allergies as of 01/07/2020 - Review Complete 01/07/2020  Allergen Reaction Noted  . Ciprofloxacin Nausea Only 07/14/2014  . Flagyl [metronidazole] Nausea Only 06/21/2016    History reviewed. No pertinent family history.  Social History   Socioeconomic History  . Marital status: Married    Spouse name: Not on file  . Number of children: Not on file  . Years of education: Not on file  . Highest education level: Not on file  Occupational History  . Not on file  Tobacco Use  . Smoking status: Former Smoker    Quit date:  02/06/1993    Years since quitting: 26.9  . Smokeless tobacco: Never Used  Vaping Use  . Vaping Use: Never used  Substance and Sexual Activity  . Alcohol use: No  . Drug use: No  . Sexual activity: Not Currently  Other Topics Concern  . Not on file  Social History Narrative  . Not on file   Social Determinants of Health   Financial Resource Strain: Not on file  Food Insecurity: Not on file  Transportation Needs: Not on file  Physical Activity: Not on file  Stress: Not on file  Social Connections: Not on file  Intimate Partner Violence: Not on file    Review of Systems: See HPI, otherwise negative ROS  Physical Exam: BP (!) 155/62   Pulse 73   Temp 97.8 F (36.6 C) (Tympanic)   Resp 16   Ht 5\' 6"  (1.676 m)   Wt 56.9 kg   SpO2 99%   BMI 20.26 kg/m  General:   Alert,  pleasant and cooperative in NAD Head:  Normocephalic and atraumatic. Neck:  Supple; no masses or thyromegaly. Lungs:  Clear throughout to auscultation.    Heart:  Regular rate and rhythm. Abdomen:  Soft, nontender and nondistended. Normal bowel sounds, without guarding, and without rebound.   Neurologic:  Alert and  oriented x4;  grossly normal neurologically.  Impression/Plan: Donald [name] is here for an endoscopy to be performed for weight loss abd pain  Risks, benefits, limitations, and alternatives regarding  endoscopy have been reviewed with the patient.  Questions have been answered.  All parties agreeable.   Lucilla Lame, MD  01/15/2020, 8:42 AM

## 2020-01-15 NOTE — Transfer of Care (Signed)
Immediate Anesthesia Transfer of Care Note  Patient: Donald [name]  Procedure(s) Performed: ESOPHAGOGASTRODUODENOSCOPY (EGD) WITH PROPOFOL (N/A )  Patient Location: PACU  Anesthesia Type: General  Level of Consciousness: awake, alert  and patient cooperative  Airway and Oxygen Therapy: Patient Spontanous Breathing and Patient connected to supplemental oxygen  Post-op Assessment: Post-op Vital signs reviewed, Patient's Cardiovascular Status Stable, Respiratory Function Stable, Patent Airway and No signs of Nausea or vomiting  Post-op Vital Signs: Reviewed and stable  Complications: No complications documented.

## 2020-01-15 NOTE — Anesthesia Preprocedure Evaluation (Signed)
Anesthesia Evaluation  Patient identified by MRN, date of birth, ID band Patient awake    Reviewed: Allergy & Precautions, H&P , NPO status , Patient's Chart, lab work & pertinent test results  Airway Mallampati: II  TM Distance: >3 FB Neck ROM: full    Dental  (+) Edentulous Upper, Edentulous Lower   Pulmonary former smoker,    Pulmonary exam normal breath sounds clear to auscultation       Cardiovascular hypertension, + CAD and + Cardiac Stents  Normal cardiovascular exam Rhythm:regular Rate:Normal     Neuro/Psych    GI/Hepatic PUD, GERD  ,  Endo/Other    Renal/GU      Musculoskeletal   Abdominal   Peds  Hematology   Anesthesia Other Findings   Reproductive/Obstetrics                             Anesthesia Physical Anesthesia Plan  ASA: II  Anesthesia Plan: General   Post-op Pain Management:    Induction: Intravenous  PONV Risk Score and Plan: 2 and Treatment may vary due to age or medical condition, TIVA and Propofol infusion  Airway Management Planned: Natural Airway  Additional Equipment:   Intra-op Plan:   Post-operative Plan:   Informed Consent: I have reviewed the patients History and Physical, chart, labs and discussed the procedure including the risks, benefits and alternatives for the proposed anesthesia with the patient or authorized representative who has indicated his/her understanding and acceptance.     Dental Advisory Given  Plan Discussed with: CRNA  Anesthesia Plan Comments:         Anesthesia Quick Evaluation

## 2020-01-16 ENCOUNTER — Encounter: Payer: Self-pay | Admitting: Gastroenterology

## 2020-01-16 LAB — SURGICAL PATHOLOGY

## 2020-01-22 ENCOUNTER — Telehealth: Payer: Self-pay | Admitting: Gastroenterology

## 2020-01-22 NOTE — Telephone Encounter (Signed)
Patient reports that he is having chest pain but insists its his stomach. He was told to go to the ER if he is having chest pain. He was also told to take the dexlant in the morning and a dose of protonix in the evening. He was also told that he had a large ulce and that it should not cause this muct pain.

## 2020-01-22 NOTE — Telephone Encounter (Signed)
Please call patient. He is having stomach issues since his procedure

## 2020-01-22 NOTE — Telephone Encounter (Signed)
Patient called and still having terrible chest and stomach pain. Just had an EGD done on 01/15/20. What can he do as he feels terrible.

## 2020-02-03 ENCOUNTER — Telehealth: Payer: Self-pay | Admitting: Gastroenterology

## 2020-02-03 NOTE — Telephone Encounter (Signed)
Spoke with pt regarding Dr. Annabell Sabal recommendation. Pt stated Dr. Servando Snare had contacted him and advised him if the pain continues he will need to go to the ER. Pt stated he went to the [place] in [place] and a CT scan and blood work was done. I have request pt to call them and request all this information to be sent to [place] for Dr. Annabell Sabal review.

## 2020-02-03 NOTE — Telephone Encounter (Signed)
Patient still having stomach pain and unable to eat.  He states that he is getting weaker, please advise

## 2020-02-03 NOTE — Telephone Encounter (Signed)
Please advise. I know you spoke with him a couple of weeks ago.

## 2020-02-03 NOTE — Telephone Encounter (Signed)
Have the patient set up for a CT scan of the abdomen and pelvis to look for source of his abdominal pain.  Nothing in his workup has been found to explain such significant pain with weakness and inability to eat.

## 2020-02-09 ENCOUNTER — Telehealth: Payer: Self-pay

## 2020-02-09 ENCOUNTER — Other Ambulatory Visit: Payer: Self-pay

## 2020-02-09 MED ORDER — PANTOPRAZOLE SODIUM 40 MG PO TBEC
40.0000 mg | DELAYED_RELEASE_TABLET | Freq: Every day | ORAL | 0 refills | Status: DC
Start: 2020-02-09 — End: 2020-03-10

## 2020-02-09 NOTE — Telephone Encounter (Signed)
Spoke with pt and advised him of Dr. Annabell Sabal recommendations. Per Dr. Servando Snare, he had a pretty large gastric ulcer. He will need to continue taking Dexilant 60mg  daily in the morning and Pantoprazole 40mg  in the evening. This ulcer will take 6 to 8 weeks to heal. Pt also advised if ulcer does not heal, then he can be referred to a surgeon to remove the ulcer. Pt has been advised this and to try drinking Ensure or Boost to help with his weight loss. Pt verbalized understanding and will contact me in

## 2020-02-09 NOTE — Telephone Encounter (Signed)
Patient left a voicemail because he states he wants someone to call him today about his results. He stated in the voicemail he felt terrible

## 2020-02-12 ENCOUNTER — Other Ambulatory Visit: Payer: Self-pay

## 2020-03-01 ENCOUNTER — Other Ambulatory Visit: Payer: Self-pay

## 2020-03-10 ENCOUNTER — Other Ambulatory Visit: Payer: Self-pay

## 2020-03-10 MED ORDER — PANTOPRAZOLE SODIUM 40 MG PO TBEC: 40.0000 mg | DELAYED_RELEASE_TABLET | Freq: Every day | ORAL | 6 refills | Status: DC

## 2020-09-07 ENCOUNTER — Other Ambulatory Visit: Payer: Self-pay | Admitting: Gastroenterology

## 2020-10-06 NOTE — Telephone Encounter (Signed)
complete

## 2020-11-16 DIAGNOSIS — Z23 Encounter for immunization: Secondary | ICD-10-CM | POA: Diagnosis not present

## 2020-11-17 DIAGNOSIS — S2242XA Multiple fractures of ribs, left side, initial encounter for closed fracture: Secondary | ICD-10-CM | POA: Diagnosis not present

## 2020-11-17 DIAGNOSIS — S20212A Contusion of left front wall of thorax, initial encounter: Secondary | ICD-10-CM | POA: Diagnosis not present

## 2023-03-29 ENCOUNTER — Ambulatory Visit: Payer: No Typology Code available for payment source | Admitting: Podiatry

## 2023-04-03 ENCOUNTER — Ambulatory Visit (INDEPENDENT_AMBULATORY_CARE_PROVIDER_SITE_OTHER): Payer: No Typology Code available for payment source | Admitting: Podiatry

## 2023-04-03 DIAGNOSIS — M7751 Other enthesopathy of right foot: Secondary | ICD-10-CM | POA: Diagnosis not present

## 2023-04-03 DIAGNOSIS — L600 Ingrowing nail: Secondary | ICD-10-CM

## 2023-04-03 NOTE — Progress Notes (Signed)
 Subjective:  Patient ID: Donald [name], male    DOB: 18-Sep-1945,  MRN: [id number]  Chief Complaint  Patient presents with   Arthritis    Pt stated that he was told he had arthritis in his big toe    78 y.o. male presents with the above complaint.  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 Denies N/V/F/Ch.  Past Medical History:  Diagnosis Date   Anemia    Arthritis    Osteoarthritis   Coronary artery disease    Full dentures    upper and lower   GERD (gastroesophageal reflux disease)    Hyperlipidemia    Hypertension     Current Outpatient Medications:    alfuzosin (UROXATRAL) 10 MG 24 hr tablet, Take 10 mg by mouth at bedtime., Disp: , Rfl:    amLODipine (NORVASC) 10 MG tablet, Take 5 mg by mouth daily. , Disp: , Rfl:    aspirin EC 81 MG tablet, Take 81 mg by mouth daily., Disp: , Rfl:    atorvastatin (LIPITOR) 80 MG tablet, Take 80 mg by mouth every evening. , Disp: , Rfl:    carvedilol (COREG) 3.125 MG tablet, Take 3.125 mg by mouth 2 (two) times daily with a meal. Morning & afternoon, Disp: , Rfl:    clonazePAM (KLONOPIN) 1 MG tablet, Take 1 mg by mouth at bedtime., Disp: , Rfl:    diclofenac (VOLTAREN) 75 MG EC tablet, Take 75 mg by mouth 2 (two) times daily. , Disp: , Rfl:    docusate sodium (COLACE) 50 MG capsule, Take 1 capsule (50 mg total) by mouth 2 (two) times daily. (Patient taking differently: Take 50 mg by mouth 2 (two) times daily as  needed for mild constipation.), Disp: 10 capsule, Rfl: 0   FEROSUL 325 (65 Fe) MG tablet, TAKE ONE TABLET BY MOUTH EVERY MORNING WITH BREAKFAST, Disp: 15 tablet, Rfl: 0   lisinopril (PRINIVIL,ZESTRIL) 20 MG tablet, Take 10 mg by mouth daily. , Disp: , Rfl:    ondansetron (ZOFRAN) 4 MG tablet, Take 1 tablet (4 mg total) by mouth every 8 (eight) hours as needed for nausea or vomiting., Disp: 20 tablet, Rfl: 0   pantoprazole (PROTONIX) 40 MG tablet, TAKE ONE TABLET BY MOUTH ONCE DAILY, Disp: 30 tablet, Rfl: 6   vitamin C (ASCORBIC ACID) 500 MG tablet, Take 500 mg by mouth daily. , Disp: , Rfl:   Social History   Tobacco Use  Smoking Status Former   Current packs/day: 0.00   Types: Cigarettes   Quit date: 02/06/1993   Years since quitting: 30.1  Smokeless Tobacco Never    Allergies  Allergen Reactions   Ciprofloxacin Nausea Only   Flagyl [Metronidazole] Nausea Only   Objective:  There were no vitals filed for this visit. There is no height or weight on file to calculate BMI. Constitutional Well developed. Well nourished.  Vascular Dorsalis pedis pulses  palpable bilaterally. Posterior tibial pulses palpable bilaterally. Capillary refill normal to all digits.  No cyanosis or clubbing noted. Pedal hair growth normal.  Neurologic Normal speech. Oriented to person, place, and time. Epicritic sensation to light touch grossly present bilaterally.  Dermatologic Painful ingrowing nail at medial nail borders of the hallux nail bilaterally. No other open wounds. No skin lesions.  Orthopedic: Pain on palpation right lateral ankle gutter pain with range of motion of the ankle joint deep intra-articular pain noted   Radiographs: None Assessment:   1. Ingrown toenail of right foot   2. Ingrown left big toenail   3. Capsulitis of ankle, right    Plan:  Patient was evaluated and treated and all questions answered.  Right ankle capsulitis -All questions and concerns were discussed with  the patient in extensive detail given the amount of pain that he is experiencing he will benefit from steroid injection to help decrease acute inflammatory component associate with pain.  Patient agrees with plan like to proceed with steroid injection -A steroid injection was performed at Right ankle joint using 1% plain Lidocaine and 10 mg of Kenalog. This was well tolerated.   Ingrown Nail, bilaterally -Patient elects to proceed with minor surgery to remove ingrown toenail removal today. Consent reviewed and signed by patient. -Ingrown nail excised. See procedure note. -Educated on post-procedure care including soaking. Written instructions provided and reviewed. -Patient to follow up in 2 weeks for nail check.  Procedure: Excision of Ingrown Toenail Location: Bilateral 1st toe medial nail borders. Anesthesia: Lidocaine 1% plain; 1.5 mL and Marcaine 0.5% plain; 1.5 mL, digital block. Skin Prep: Betadine. Dressing: Silvadene; telfa; dry, sterile, compression dressing. Technique: Following skin prep, the toe was exsanguinated and a tourniquet was secured at the base of the toe. The affected nail border was freed, split with a nail splitter, and excised. Chemical matrixectomy was then performed with phenol and irrigated out with alcohol. The tourniquet was then removed and sterile dressing applied. Disposition: Patient tolerated procedure well. Patient to return in 2 weeks for follow-up.   No follow-ups on file.

## 2023-05-24 ENCOUNTER — Other Ambulatory Visit: Payer: Self-pay | Admitting: Physician Assistant

## 2023-05-24 DIAGNOSIS — M25512 Pain in left shoulder: Secondary | ICD-10-CM

## 2023-05-31 ENCOUNTER — Ambulatory Visit
Admission: RE | Admit: 2023-05-31 | Discharge: 2023-05-31 | Disposition: A | Discharge status: Home / Self Care | Source: Ambulatory Visit | Attending: Physician Assistant | Admitting: Physician Assistant

## 2023-05-31 DIAGNOSIS — M25512 Pain in left shoulder: Secondary | ICD-10-CM | POA: Insufficient documentation

## 2023-06-05 ENCOUNTER — Ambulatory Visit (INDEPENDENT_AMBULATORY_CARE_PROVIDER_SITE_OTHER): Admitting: Podiatry

## 2023-06-05 DIAGNOSIS — L600 Ingrowing nail: Secondary | ICD-10-CM | POA: Diagnosis not present

## 2023-06-05 NOTE — Progress Notes (Signed)
 Subjective:  Patient ID: Donald [name], male    DOB: [birth year],  MRN: [id number]  Chief Complaint  Patient presents with   Nail Problem    Pt stated that his nail feels like it is split     78 y.o. male presents with the above complaint.  Patient presents with left hallux lateral border ingrown painful to touch is progressive and worse worse with ambulation or shoe pressure patient would like to have it removed has not seen MRIs prior to seeing me denies any other acute, pain scale 7 out of 10 dull aching nature   Review of Systems: Negative except as noted in the HPI. Denies N/V/F/Ch.  Past Medical History:  Diagnosis Date   Anemia    Arthritis    Osteoarthritis   Coronary artery disease    Full dentures    upper and lower   GERD (gastroesophageal reflux disease)    Hyperlipidemia    Hypertension     Current Outpatient Medications:    alfuzosin  (UROXATRAL ) 10 MG 24 hr tablet, Take 10 mg by mouth at bedtime., Disp: , Rfl:    amLODipine  (NORVASC ) 10 MG tablet, Take 5 mg by mouth daily. , Disp: , Rfl:    aspirin  EC 81 MG tablet, Take 81 mg by mouth daily., Disp: , Rfl:    atorvastatin  (LIPITOR ) 80 MG tablet, Take 80 mg by mouth every evening. , Disp: , Rfl:    carvedilol  (COREG ) 3.125 MG tablet, Take 3.125 mg by mouth 2 (two) times daily with a meal. Morning & afternoon, Disp: , Rfl:    clonazePAM  (KLONOPIN ) 1 MG tablet, Take 1 mg by mouth at bedtime., Disp: , Rfl:    diclofenac  (VOLTAREN ) 75 MG EC tablet, Take 75 mg by mouth 2 (two) times daily. , Disp: , Rfl:    docusate sodium  (COLACE) 50 MG capsule, Take 1 capsule (50 mg total) by mouth 2 (two) times daily. (Patient taking differently: Take 50 mg by mouth 2 (two) times daily as needed for mild constipation.), Disp: 10 capsule, Rfl: 0   FEROSUL 325 (65 Fe) MG tablet, TAKE ONE TABLET BY MOUTH EVERY MORNING WITH BREAKFAST, Disp: 15 tablet, Rfl: 0   lisinopril  (PRINIVIL ,ZESTRIL ) 20 MG tablet, Take 10 mg by mouth daily. , Disp: ,  Rfl:    ondansetron  (ZOFRAN ) 4 MG tablet, Take 1 tablet (4 mg total) by mouth every 8 (eight) hours as needed for nausea or vomiting., Disp: 20 tablet, Rfl: 0   pantoprazole  (PROTONIX ) 40 MG tablet, TAKE ONE TABLET BY MOUTH ONCE DAILY, Disp: 30 tablet, Rfl: 6   vitamin C  (ASCORBIC ACID ) 500 MG tablet, Take 500 mg by mouth daily. , Disp: , Rfl:   Social History   Tobacco Use  Smoking Status Former   Current packs/day: 0.00   Types: Cigarettes   Quit date: 02/06/1993   Years since quitting: 30.3  Smokeless Tobacco Never    Allergies  Allergen Reactions   Ciprofloxacin Nausea Only   Flagyl  [Metronidazole ] Nausea Only   Objective:  There were no vitals filed for this visit. There is no height or weight on file to calculate BMI. Constitutional Well developed. Well nourished.  Vascular Dorsalis pedis pulses palpable bilaterally. Posterior tibial pulses palpable bilaterally. Capillary refill normal to all digits.  No cyanosis or clubbing noted. Pedal hair growth normal.  Neurologic Normal speech. Oriented to person, place, and time. Epicritic sensation to light touch grossly present bilaterally.  Dermatologic Painful ingrowing nail at lateral nail borders  of the hallux nail left. No other open wounds. No skin lesions.  Orthopedic: Normal joint ROM without pain or crepitus bilaterally. No visible deformities. No bony tenderness.   Radiographs: None Assessment:  No diagnosis found. Plan:  Patient was evaluated and treated and all questions answered.  Ingrown Nail, left -Patient elects to proceed with minor surgery to remove ingrown toenail removal today. Consent reviewed and signed by patient. -Ingrown nail excised. See procedure note. -Educated on post-procedure care including soaking. Written instructions provided and reviewed. -Patient to follow up in 2 weeks for nail check.  Procedure: Excision of Ingrown Toenail Location: Left 1st toe lateral nail borders. Anesthesia:  Lidocaine  1% plain; 1.5 mL and Marcaine  0.5% plain; 1.5 mL, digital block. Skin Prep: Betadine. Dressing: Silvadene; telfa; dry, sterile, compression dressing. Technique: Following skin prep, the toe was exsanguinated and a tourniquet was secured at the base of the toe. The affected nail border was freed, split with a nail splitter, and excised. Chemical matrixectomy was then performed with phenol and irrigated out with alcohol. The tourniquet was then removed and sterile dressing applied. Disposition: Patient tolerated procedure well. Patient to return in 2 weeks for follow-up.   No follow-ups on file.
